# Patient Record
Sex: Female | Born: 1970 | Hispanic: No | Marital: Married | Smoking: Former smoker
Health system: Southern US, Community
[De-identification: ages and names within clinical notes are randomized; demographics above are authoritative.]

## PROBLEM LIST (undated history)

## (undated) DIAGNOSIS — F419 Anxiety disorder, unspecified: Secondary | ICD-10-CM

## (undated) DIAGNOSIS — Z789 Other specified health status: Secondary | ICD-10-CM

## (undated) DIAGNOSIS — E162 Hypoglycemia, unspecified: Secondary | ICD-10-CM

## (undated) HISTORY — DX: Hypoglycemia, unspecified: E16.2

## (undated) HISTORY — DX: Anxiety disorder, unspecified: F41.9

## (undated) HISTORY — DX: Other specified health status: Z78.9

---

## 1995-05-24 HISTORY — PX: BREAST LUMPECTOMY: SHX2

## 2011-11-22 DIAGNOSIS — F419 Anxiety disorder, unspecified: Secondary | ICD-10-CM | POA: Insufficient documentation

## 2014-06-19 DIAGNOSIS — Z803 Family history of malignant neoplasm of breast: Secondary | ICD-10-CM | POA: Insufficient documentation

## 2019-02-07 ENCOUNTER — Ambulatory Visit: Payer: Self-pay | Admitting: Family Medicine

## 2019-10-28 ENCOUNTER — Telehealth: Payer: Self-pay

## 2019-10-29 ENCOUNTER — Telehealth: Payer: Self-pay | Admitting: General Practice

## 2019-10-29 NOTE — Telephone Encounter (Signed)
That's fine. Can schedule all back to back if requested. Ty.

## 2019-10-29 NOTE — Telephone Encounter (Signed)
Good Morning ,   Patient states she and her family (4 people) would like to establish care with you. Per patient she and her family are not in a rush to get in to see you . child are out of school for summer break and would like to see you before they return in the fall.   Please Advise

## 2019-12-02 ENCOUNTER — Ambulatory Visit: Payer: PRIVATE HEALTH INSURANCE | Admitting: Family Medicine

## 2019-12-02 ENCOUNTER — Other Ambulatory Visit: Payer: Self-pay

## 2019-12-02 ENCOUNTER — Encounter: Payer: Self-pay | Admitting: Family Medicine

## 2019-12-02 VITALS — BP 104/68 | HR 66 | Temp 98.0°F | Ht 59.0 in | Wt 117.0 lb

## 2019-12-02 DIAGNOSIS — N939 Abnormal uterine and vaginal bleeding, unspecified: Secondary | ICD-10-CM | POA: Diagnosis not present

## 2019-12-02 DIAGNOSIS — Z114 Encounter for screening for human immunodeficiency virus [HIV]: Secondary | ICD-10-CM | POA: Diagnosis not present

## 2019-12-02 DIAGNOSIS — Z1159 Encounter for screening for other viral diseases: Secondary | ICD-10-CM

## 2019-12-02 DIAGNOSIS — Z23 Encounter for immunization: Secondary | ICD-10-CM

## 2019-12-02 DIAGNOSIS — Z0000 Encounter for general adult medical examination without abnormal findings: Secondary | ICD-10-CM

## 2019-12-02 NOTE — Patient Instructions (Addendum)
Call Center for [facility] at [facility] at [phone] for an appointment.  They are located at [address], [place], [place], [address] (right across the hall from our office).  Give [place] 2-3 business days to get the results of your labs back.   Continue the iron tabs.  Keep the diet clean and stay active.  Let us know if you need anything.

## 2019-12-02 NOTE — Progress Notes (Signed)
Chief Complaint  Patient presents with   New Patient (Initial Visit)   Menstrual Problem    bleeding since June 25     Well Woman Pamela Hicks is here for a complete physical.   Her last physical was >1 year ago.  Current diet: in general, a "healthy" diet. Current exercise: running, wt resistance exercise. Weight is stable and she confirms fatigue. Seatbelt? Yes  Health Maintenance Pap/HPV- No Mammogram- Yes Tetanus- No Hep C screening- No HIV screening- No  Past Medical History:  Diagnosis Date   No known health problems      Past Surgical History:  Procedure Laterality Date   BREAST LUMPECTOMY Right 1997    Medications  Current Outpatient Medications on File Prior to Visit  Medication Sig Dispense Refill   ascorbic acid (VITAMIN C) 500 MG tablet Take 500 mg by mouth daily.     Calcium-Magnesium-Vitamin D (CALCIUM MAGNESIUM PO) Take 1 tablet by mouth daily.     Cyanocobalamin (VITAMIN B 12 PO) Take 1 tablet by mouth daily.     ferrous sulfate 325 (65 FE) MG tablet Take 325 mg by mouth daily with breakfast.     Homeopathic Products (LIVER SUPPORT SL) Place 1 tablet under the tongue daily.     Vitamin D, Cholecalciferol, 25 MCG (1000 UT) CAPS Take 1,000 Units by mouth daily.     Allergies No Known Allergies  Review of Systems: Constitutional:  no unexpected weight changes Eye:  no recent significant change in vision Ear/Nose/Mouth/Throat:  Ears:  no recent change in hearing Nose/Mouth/Throat:  no complaints of nasal congestion, no sore throat Cardiovascular: no chest pain Respiratory:  no shortness of breath Gastrointestinal:  no abdominal pain, no change in bowel habits GU:  Female: negative for dysuria or pelvic pain; +urterine bleeding since 6/25 Musculoskeletal/Extremities:  no pain of the joints Integumentary (Skin/Breast):  no abnormal skin lesions reported Neurologic:  no headaches Endocrine:  denies fatigue Hematologic/Lymphatic:  No  areas of easy bleeding  Exam BP 104/68 (BP Location: Left Arm, Patient Position: Sitting, Cuff Size: Normal)    Pulse 66    Temp 98 F (36.7 C) (Oral)    Ht 4\' 11"  (1.499 m)    Wt 117 lb (53.1 kg)    SpO2 98%    BMI 23.63 kg/m  General:  well developed, well nourished, in no apparent distress Skin:  no significant moles, warts, or growths Head:  no masses, lesions, or tenderness Eyes:  pupils equal and round, sclera anicteric without injection Ears:  canals without lesions, TMs shiny without retraction, no obvious effusion, no erythema Nose:  nares patent, septum midline, mucosa normal, and no drainage or sinus tenderness Throat/Pharynx:  lips and gingiva without lesion; tongue and uvula midline; non-inflamed pharynx; no exudates or postnasal drainage Neck: neck supple without adenopathy, thyromegaly, or masses Lungs:  clear to auscultation, breath sounds equal bilaterally, no respiratory distress Cardio:  regular rate and rhythm, no LE edema Abdomen:  abdomen soft, nontender; bowel sounds normal; no masses or organomegaly Genital: Defer to GYN Musculoskeletal:  symmetrical muscle groups noted without atrophy or deformity Extremities:  no clubbing, cyanosis, or edema, no deformities, no skin discoloration Neuro:  gait normal; deep tendon reflexes normal and symmetric Psych: well oriented with normal range of affect and appropriate judgment/insight  Assessment and Plan  Well adult exam - Plan: CBC, Comprehensive metabolic panel, TSH, Lipid panel, Lipid panel, TSH, Comprehensive metabolic panel, CBC  Abnormal uterine bleeding (AUB)  Screening for HIV (human  immunodeficiency virus) - Plan: HIV Antibody (routine testing w rflx), HIV Antibody (routine testing w rflx)  Encounter for hepatitis C screening test for low risk patient - Plan: Hepatitis C antibody, Hepatitis C antibody  Need for Tdap vaccination - Plan: Tdap vaccine greater than or equal to 7yo IM   Well 49 y.o.  female. Counseled on diet and exercise. Ck labs. If neg, will offer 5 d progesterone challenge. +famhx of hromone+ BC, not sure she wants this but agrees to await lab results. GYN info provided for across the hall so she can est in the area.  Other orders as above. Follow up in 1 yr for CPE or prn otherwise. The patient voiced understanding and agreement to the plan.  Jilda Roche [place], DO 12/02/19 3:22 PM

## 2019-12-03 LAB — LIPID PANEL
Cholesterol: 191 mg/dL (ref 0–200)
HDL: 75.8 mg/dL (ref >39.00)
LDL Cholesterol: 100 mg/dL — ABNORMAL HIGH (ref 0–99)
NonHDL: 115.64
Total CHOL/HDL Ratio: 3
Triglycerides: 78 mg/dL (ref 0.0–149.0)
VLDL: 15.6 mg/dL (ref 0.0–40.0)

## 2019-12-03 LAB — COMPREHENSIVE METABOLIC PANEL
ALT: 12 U/L (ref 0–35)
AST: 18 U/L (ref 0–37)
Albumin: 4.4 g/dL (ref 3.5–5.2)
Alkaline Phosphatase: 58 U/L (ref 39–117)
BUN: 9 mg/dL (ref 6–23)
CO2: 32 mEq/L (ref 19–32)
Calcium: 9.9 mg/dL (ref 8.4–10.5)
Chloride: 100 mEq/L (ref 96–112)
Creatinine, Ser: 0.66 mg/dL (ref 0.40–1.20)
GFR: 95.57 mL/min (ref >60.00)
Glucose, Bld: 82 mg/dL (ref 70–99)
Potassium: 4.8 mEq/L (ref 3.5–5.1)
Sodium: 138 mEq/L (ref 135–145)
Total Bilirubin: 0.5 mg/dL (ref 0.2–1.2)
Total Protein: 6.7 g/dL (ref 6.0–8.3)

## 2019-12-03 LAB — HIV ANTIBODY (ROUTINE TESTING W REFLEX): HIV 1&2 Ab, 4th Generation: NONREACTIVE

## 2019-12-03 LAB — CBC
HCT: 39.8 % (ref 36.0–46.0)
Hemoglobin: 13.6 g/dL (ref 12.0–15.0)
MCHC: 34.2 g/dL (ref 30.0–36.0)
MCV: 94.5 fl (ref 78.0–100.0)
Platelets: 332 10*3/uL (ref 150.0–400.0)
RBC: 4.21 Mil/uL (ref 3.87–5.11)
RDW: 12.6 % (ref 11.5–15.5)
WBC: 9 10*3/uL (ref 4.0–10.5)

## 2019-12-03 LAB — HEPATITIS C ANTIBODY
Hepatitis C Ab: NONREACTIVE
SIGNAL TO CUT-OFF: 0.01 (ref <1.00)

## 2019-12-03 LAB — TSH: TSH: 0.79 u[IU]/mL (ref 0.35–4.50)

## 2019-12-03 MED ORDER — MEDROXYPROGESTERONE ACETATE 10 MG PO TABS: 10.0000 mg | ORAL_TABLET | Freq: Every day | ORAL | 0 refills | Status: DC

## 2019-12-03 NOTE — Addendum Note (Signed)
Addended by: Ellinor Test P on: 12/03/2019 01:38 PM   Modules accepted: Orders  

## 2019-12-18 ENCOUNTER — Other Ambulatory Visit: Payer: Self-pay | Admitting: Family Medicine

## 2019-12-18 DIAGNOSIS — N939 Abnormal uterine and vaginal bleeding, unspecified: Secondary | ICD-10-CM

## 2019-12-18 MED ORDER — NORGESTIMATE-ETH ESTRADIOL 0.25-35 MG-MCG PO TABS: 1.0000 | ORAL_TABLET | Freq: Every day | ORAL | 11 refills | Status: DC

## 2019-12-20 ENCOUNTER — Ambulatory Visit: Payer: PRIVATE HEALTH INSURANCE

## 2019-12-20 ENCOUNTER — Other Ambulatory Visit: Payer: Self-pay | Admitting: Family Medicine

## 2019-12-20 ENCOUNTER — Other Ambulatory Visit: Payer: Self-pay

## 2019-12-20 ENCOUNTER — Ambulatory Visit (INDEPENDENT_AMBULATORY_CARE_PROVIDER_SITE_OTHER): Payer: PRIVATE HEALTH INSURANCE

## 2019-12-20 DIAGNOSIS — N939 Abnormal uterine and vaginal bleeding, unspecified: Secondary | ICD-10-CM | POA: Diagnosis not present

## 2020-01-23 ENCOUNTER — Encounter: Payer: Self-pay | Admitting: Obstetrics & Gynecology

## 2020-01-23 ENCOUNTER — Other Ambulatory Visit (HOSPITAL_COMMUNITY)
Admission: RE | Admit: 2020-01-23 | Discharge: 2020-01-23 | Disposition: A | Discharge status: Home / Self Care | Payer: PRIVATE HEALTH INSURANCE | Source: Ambulatory Visit | Attending: Obstetrics & Gynecology | Admitting: Obstetrics & Gynecology

## 2020-01-23 ENCOUNTER — Ambulatory Visit (INDEPENDENT_AMBULATORY_CARE_PROVIDER_SITE_OTHER): Payer: PRIVATE HEALTH INSURANCE | Admitting: Obstetrics & Gynecology

## 2020-01-23 ENCOUNTER — Other Ambulatory Visit: Payer: Self-pay

## 2020-01-23 VITALS — BP 107/73 | HR 66 | Ht 59.0 in | Wt 112.0 lb

## 2020-01-23 DIAGNOSIS — Z01419 Encounter for gynecological examination (general) (routine) without abnormal findings: Secondary | ICD-10-CM | POA: Insufficient documentation

## 2020-01-23 DIAGNOSIS — N939 Abnormal uterine and vaginal bleeding, unspecified: Secondary | ICD-10-CM | POA: Diagnosis not present

## 2020-01-23 NOTE — Progress Notes (Signed)
Subjective:     Pamela Hicks is a 49 y.o. female here for a routine exam.G2P2  Current complaints: Pt reports 4 weeks or bleeding. Pt reports that she did not have menses in May then she bleed Jun 25- Aug 17. Cycles WNL prior to that time.  The bleeding is now completely stopped for 2 weeks.   Sister breast cancer at 42 years. Mother breast cancer 72 years   Gynecologic History Patient's last menstrual period was 11/15/2019. Contraception: condoms Last Pap: 11/22/2011. Results were: normal Last mammogram: 09/11/2019 WNL  Obstetric History OB History  Gravida Para Term Preterm AB Living  2 2 2     2   SAB TAB Ectopic Multiple Live Births               # Outcome Date GA Lbr Len/2nd Weight Sex Delivery Anes PTL Lv  2 Term           1 Term             The following portions of the patient's history were reviewed and updated as appropriate: allergies, current medications, past family history, past medical history, past social history, past surgical history and problem list.  Review of Systems Pertinent items are noted in HPI.    Objective:  BP 107/73   Pulse 66   Ht 4\' 11"  (1.499 m)   Wt 112 lb (50.8 kg)   LMP 11/15/2019   BMI 22.62 kg/m   General Appearance:    Alert, cooperative, no distress, appears stated age  Head:    Normocephalic, without obvious abnormality, atraumatic  Eyes:    conjunctiva/corneas clear, EOM's intact, both eyes  Ears:    Normal external ear canals, both ears  Nose:   Nares normal, septum midline, mucosa normal, no drainage    or sinus tenderness  Throat:   Lips, mucosa, and tongue normal; teeth and gums normal  Neck:   Supple, symmetrical, trachea midline, no adenopathy;    thyroid:  no enlargement/tenderness/nodules  Back:     Symmetric, no curvature, ROM normal, no CVA tenderness  Lungs:     Clear to auscultation bilaterally, respirations unlabored  Chest Wall:    No tenderness or deformity   Heart:    Regular rate and rhythm, S1 and S2 normal,  no murmur, rub   or gallop  Breast Exam:    No tenderness, masses, or nipple abnormality  Abdomen:     Soft, non-tender, bowel sounds active all four quadrants,    no masses, no organomegaly  Genitalia:    Normal female without lesion, discharge or tenderness   Uterus small mobile, no blood in vault. Ovaries small and palpable.    Extremities:   Extremities normal, atraumatic, no cyanosis or edema  Pulses:   2+ and symmetric all extremities  Skin:   Skin color, texture, turgor normal, no rashes or lesions    Healthy female exam.    12/20/2019 CLINICAL DATA:  Abnormal uterine bleeding, LMP 11/15/2019  EXAM: TRANSABDOMINAL AND TRANSVAGINAL ULTRASOUND OF PELVIS  TECHNIQUE: Both transabdominal and transvaginal ultrasound examinations of the pelvis were performed. Transabdominal technique was performed for global imaging of the pelvis including uterus, ovaries, adnexal regions, and pelvic cul-de-sac. It was necessary to proceed with endovaginal exam following the transabdominal exam to visualize the endometrium, and to characterize a cystic lesion in the LEFT adnexa.  COMPARISON:  None  FINDINGS: Uterus  Measurements: 8.4 x 4.4 x 5.5 cm = volume: 106 mL. Small myometrial cyst  at upper uterus 10 x 8 x 9 mm. No additional mass.  Endometrium  Thickness: 16 mm.  No endometrial fluid or focal abnormality  Right ovary  Measurements: 2.4 x 1.6 x 2.3 cm = volume: 5 mL. Dominant physiologic follicle without mass  Left ovary  Measurements: 5.1 x 3.5 x 4.2 cm = volume: 39 mL. Cyst within LEFT ovary 3.8 x 3.0 x 3.5 cm containing a single thin internal cyst question mature follicle. No additional mass  Other findings  No free pelvic fluid or additional adnexal masses.  IMPRESSION: Large dominant follicle within LEFT ovary.  Thickened endometrial complex 16 mm thick; if bleeding remains unresponsive to hormonal or medical therapy, focal lesion work-up with  sonohysterogram should be considered. Endometrial biopsy should also be considered in pre-menopausal patients at high risk for endometrial carcinoma. (Ref: Radiological Reasoning: Algorithmic Workup of Abnormal Vaginal Bleeding with Endovaginal Sonography and Sonohysterography. AJR 2008; [id number]:T01-77)  Assessment:  Well GYN- PAP performed   AUB- suspect due to perimenopause. No bleeding at present. Since this was the first occurrence followed by a skipped month and bleeding has resolved,  did not perform a endo bx. Pt counseled about perimenopausal bleeding. If the abnormal bleeding returns, pt understands that she should call and will need a endometrial biopsy to  R/o endo ca.       Plan:  F/u PAP with hrHPV F/u in 3 months or sooner prn  Pt will f/u at the [facility] office.  Written info give about perimenopause as assoc sx.    Tonimarie Gritz L. Harraway-Smith, M.D., [place]

## 2020-01-23 NOTE — Progress Notes (Signed)
Pt states last period began on 11/15/19 and lasted for 6 weeks

## 2020-01-23 NOTE — Patient Instructions (Signed)

## 2020-01-24 ENCOUNTER — Encounter: Payer: PRIVATE HEALTH INSURANCE | Admitting: Family Medicine

## 2020-01-24 ENCOUNTER — Encounter: Payer: Self-pay | Admitting: Obstetrics & Gynecology

## 2020-01-24 LAB — CYTOLOGY - PAP
Comment: NEGATIVE
Diagnosis: NEGATIVE
High risk HPV: NEGATIVE

## 2020-04-14 ENCOUNTER — Ambulatory Visit: Payer: PRIVATE HEALTH INSURANCE | Attending: Internal Medicine

## 2020-04-14 ENCOUNTER — Other Ambulatory Visit (HOSPITAL_BASED_OUTPATIENT_CLINIC_OR_DEPARTMENT_OTHER): Payer: Self-pay | Admitting: Internal Medicine

## 2020-04-14 DIAGNOSIS — Z23 Encounter for immunization: Secondary | ICD-10-CM

## 2020-04-14 NOTE — Progress Notes (Signed)
   Covid-19 Vaccination Clinic  Name:  Pamela Hicks    MRN: [id number] DOB: Jul 03, 1970  04/14/2020  [name] was observed post Covid-19 immunization for 15 minutes without incident. She was provided with Vaccine Information Sheet and instruction to access the V-Safe system.   [name] was instructed to call 911 with any severe reactions post vaccine: Marland Kitchen Difficulty breathing  . Swelling of face and throat  . A fast heartbeat  . A bad rash all over body  . Dizziness and weakness   Immunizations Administered    Name Date Dose VIS Date Route   Pfizer COVID-19 Vaccine 04/14/2020 10:00 AM 0.3 mL 03/11/2020 Intramuscular   Manufacturer: [employer], [employer]   Lot: [id number]   NDC: [address]-0

## 2020-04-17 MED FILL — PFIZER-BIONTECH COVID-19 VA: 30 | 1 days supply | Qty: 0 | Fill #0

## 2020-05-18 ENCOUNTER — Encounter: Payer: Self-pay | Admitting: Obstetrics & Gynecology

## 2020-05-18 ENCOUNTER — Other Ambulatory Visit: Payer: Self-pay

## 2020-05-18 ENCOUNTER — Ambulatory Visit (INDEPENDENT_AMBULATORY_CARE_PROVIDER_SITE_OTHER): Payer: PRIVATE HEALTH INSURANCE | Admitting: Obstetrics & Gynecology

## 2020-05-18 VITALS — BP 91/62 | HR 58 | Ht 59.0 in | Wt 113.0 lb

## 2020-05-18 DIAGNOSIS — Z1211 Encounter for screening for malignant neoplasm of colon: Secondary | ICD-10-CM

## 2020-05-18 NOTE — Progress Notes (Signed)
History:  49 y.o. [id number] here today for f/u of AUB thought due to perimenopause. Pt reports that her sx are improved and her menses have been regular. She does report some insomnia and hot flushes. She does drink a significant amount of caffeine.     The following portions of the patient's history were reviewed and updated as appropriate: allergies, current medications, past family history, past medical history, past social history, past surgical history and problem list.  Review of Systems:  Pertinent items are noted in HPI.    Objective:  Physical Exam Blood pressure 91/62, pulse (!) 58, height 4\' 11"  (1.499 m), weight 113 lb (51.3 kg), last menstrual period 05/08/2020.  CONSTITUTIONAL: Well-developed, well-nourished female in no acute distress.  HENT:  Normocephalic, atraumatic EYES: Conjunctivae and EOM are normal. No scleral icterus.  NECK: Normal range of motion SKIN: Skin is warm and dry. No rash noted. Not diaphoretic.No pallor. NEUROLGIC: Alert and oriented to person, place, and time. Normal coordination.   12/20/2019 CLINICAL DATA:  Abnormal uterine bleeding, LMP 11/15/2019  EXAM: TRANSABDOMINAL AND TRANSVAGINAL ULTRASOUND OF PELVIS  TECHNIQUE: Both transabdominal and transvaginal ultrasound examinations of the pelvis were performed. Transabdominal technique was performed for global imaging of the pelvis including uterus, ovaries, adnexal regions, and pelvic cul-de-sac. It was necessary to proceed with endovaginal exam following the transabdominal exam to visualize the endometrium, and to characterize a cystic lesion in the LEFT adnexa.  COMPARISON:  None  FINDINGS: Uterus  Measurements: 8.4 x 4.4 x 5.5 cm = volume: 106 mL. Small myometrial cyst at upper uterus 10 x 8 x 9 mm. No additional mass.  Endometrium  Thickness: 16 mm.  No endometrial fluid or focal abnormality  Right ovary  Measurements: 2.4 x 1.6 x 2.3 cm = volume: 5 mL.  Dominant physiologic follicle without mass  Left ovary  Measurements: 5.1 x 3.5 x 4.2 cm = volume: 39 mL. Cyst within LEFT ovary 3.8 x 3.0 x 3.5 cm containing a single thin internal cyst question mature follicle. No additional mass  Other findings  No free pelvic fluid or additional adnexal masses.  IMPRESSION: Large dominant follicle within LEFT ovary.  Thickened endometrial complex 16 mm thick; if bleeding remains unresponsive to hormonal or medical therapy, focal lesion work-up with sonohysterogram should be considered. Endometrial biopsy should also be considered in pre-menopausal patients at high risk for endometrial carcinoma. (Ref: Radiological Reasoning: Algorithmic Workup of Abnormal Vaginal Bleeding with Endovaginal Sonography and Sonohysterography. AJR 200806-01-2000)   Assessment & Plan:  Perimenopause- Pt bleeding sx have improved. Hot flushes are worse. Insomnia.    Cut down or stop the caffeine.   Will hold off on management of the hot flushes for now.   F/u in 8-9 months for annual or sooner prn   Total face-to-face time with patient was 20 min.  Greater than 50% was spent in counseling and coordination of care with the patient.   Shawny Borkowski L. Harraway-Smith, M.D., ; [phone]

## 2020-05-18 NOTE — Patient Instructions (Signed)

## 2020-11-16 ENCOUNTER — Other Ambulatory Visit: Payer: Self-pay

## 2020-11-16 ENCOUNTER — Ambulatory Visit: Payer: No Typology Code available for payment source | Admitting: Family Medicine

## 2020-11-16 ENCOUNTER — Encounter: Payer: Self-pay | Admitting: Family Medicine

## 2020-11-16 VITALS — BP 118/78 | HR 70 | Temp 98.0°F | Ht 60.0 in | Wt 116.0 lb

## 2020-11-16 DIAGNOSIS — S46819A Strain of other muscles, fascia and tendons at shoulder and upper arm level, unspecified arm, initial encounter: Secondary | ICD-10-CM | POA: Diagnosis not present

## 2020-11-16 DIAGNOSIS — M25511 Pain in right shoulder: Secondary | ICD-10-CM

## 2020-11-16 DIAGNOSIS — M25512 Pain in left shoulder: Secondary | ICD-10-CM | POA: Diagnosis not present

## 2020-11-16 MED ORDER — MELOXICAM 15 MG PO TABS: 15.0000 mg | ORAL_TABLET | Freq: Every day | ORAL | 0 refills | Status: DC

## 2020-11-16 NOTE — Progress Notes (Signed)
Musculoskeletal Exam  Patient: Pamela Hicks DOB: 01/28/1971  DOS: 11/16/2020  SUBJECTIVE:  Chief Complaint:   Chief Complaint  Patient presents with   Shoulder Pain    Right and left shoulders hurting     Pamela Hicks is a 50 y.o.  female for evaluation and treatment of bilateral shoulder pain.   Onset:  5 weeks ago. No inj or change in activity.  Location: From neck to shoulder on both  Character:  aching  Using it will hurt Progression of issue:  is worsening Associated symptoms: No redness, bruising, swelling, decreased ROM Treatment: to date has been rest and ice.   Neurovascular symptoms: no  Past Medical History:  Diagnosis Date   No known health problems     Objective: VITAL SIGNS: BP 118/78   Pulse 70   Temp 98 F (36.7 C) (Oral)   Ht 5' (1.524 m)   Wt 116 lb (52.6 kg)   SpO2 98%   BMI 22.65 kg/m  Constitutional: Well formed, well developed. No acute distress. Thorax & Lungs: No accessory muscle use Musculoskeletal: shoulders.   Normal active range of motion: yes.   Normal passive range of motion: yes Tenderness to palpation: yes Deformity: no Ecchymosis: no Tests positive: Hawkins and empty can on L Tests negative: Hawkins and empty can on R; Neer's, lift off, speed's,  Neurologic: Normal sensory function. No focal deficits noted. DTR's equal and symmetric in UE's. No clonus. Psychiatric: Normal mood. Age appropriate judgment and insight. Alert & oriented x 3.    Assessment:  Strain of trapezius muscle, unspecified laterality, initial encounter - Plan: meloxicam (MOBIC) 15 MG tablet  Acute pain of both shoulders - Plan: meloxicam (MOBIC) 15 MG tablet  Plan: Stretches/exercises, heat, ice, Tylenol.  Send message in 1 month if no improvement.  We will set up with sports medicine versus physical therapy. F/u prn. The patient voiced understanding and agreement to the plan.   Jilda Roche [place], DO 11/16/20  12:12 PM

## 2020-11-16 NOTE — Patient Instructions (Addendum)
Heat (pad or rice pillow in microwave) over affected area, 10-15 minutes twice daily.   Ice/cold pack over area for 10-15 min twice daily.  OK to take Tylenol 1000 mg (2 extra strength tabs) or 975 mg (3 regular strength tabs) every 6 hours as needed.  Send me a message in 1 month if we aren't improving.   Let us know if you need anything.  Trapezius stretches/exercises Do exercises exactly as told by your health care provider and adjust them as directed. It is normal to feel mild stretching, pulling, tightness, or discomfort as you do these exercises, but you should stop right away if you feel sudden pain or your pain gets worse.   Stretching and range of motion exercises These exercises warm up your muscles and joints and improve the movement and flexibility of your shoulder. These exercises can also help to relieve pain, numbness, and tingling. If you are unable to do any of the following for any reason, do not further attempt to do it.   Exercise A: Flexion, standing     Stand and hold a broomstick, a cane, or a similar object. Place your hands a little more than shoulder-width apart on the object. Your left / right hand should be palm-up, and your other hand should be palm-down. Push the stick to raise your left / right arm out to your side and then over your head. Use your other hand to help move the stick. Stop when you feel a stretch in your shoulder, or when you reach the angle that is recommended by your health care provider. Avoid shrugging your shoulder while you raise your arm. Keep your shoulder blade tucked down toward your spine. Hold for 30 seconds. Slowly return to the starting position. Repeat 2 times. Complete this exercise 3 times per week.  Exercise B: Abduction, supine     Lie on your back and hold a broomstick, a cane, or a similar object. Place your hands a little more than shoulder-width apart on the object. Your left / right hand should be palm-up, and your  other hand should be palm-down. Push the stick to raise your left / right arm out to your side and then over your head. Use your other hand to help move the stick. Stop when you feel a stretch in your shoulder, or when you reach the angle that is recommended by your health care provider. Avoid shrugging your shoulder while you raise your arm. Keep your shoulder blade tucked down toward your spine. Hold for 30 seconds. Slowly return to the starting position. Repeat 2 times. Complete this exercise 3 times per week.  Exercise C: Flexion, active-assisted     Lie on your back. You may bend your knees for comfort. Hold a broomstick, a cane, or a similar object. Place your hands about shoulder-width apart on the object. Your palms should face toward your feet. Raise the stick and move your arms over your head and behind your head, toward the floor. Use your healthy arm to help your left / right arm move farther. Stop when you feel a gentle stretch in your shoulder, or when you reach the angle where your health care provider tells you to stop. Hold for 30 seconds. Slowly return to the starting position. Repeat 2 times. Complete this exercise 3 times per week.  Exercise D: External rotation and abduction     Stand in a door frame with one of your feet slightly in front of the other. This  is called a staggered stance. Choose one of the following positions as told by your health care provider: Place your hands and forearms on the door frame above your head. Place your hands and forearms on the door frame at the height of your head. Place your hands on the door frame at the height of your elbows. Slowly move your weight onto your front foot until you feel a stretch across your chest and in the front of your shoulders. Keep your head and chest upright and keep your abdominal muscles tight. Hold for 30 seconds. To release the stretch, shift your weight to your back foot. Repeat 2 times. Complete this  stretch 3 times per week.  Strengthening exercises These exercises build strength and endurance in your shoulder. Endurance is the ability to use your muscles for a long time, even after your muscles get tired. Exercise E: Scapular depression and adduction  Sit on a stable chair. Support your arms in front of you with pillows, armrests, or a tabletop. Keep your elbows in line with the sides of your body. Gently move your shoulder blades down toward your middle back. Relax the muscles on the tops of your shoulders and in the back of your neck. Hold for 3 seconds. Slowly release the tension and relax your muscles completely before doing this exercise again. Repeat for a total of 10 repetitions. After you have practiced this exercise, try doing the exercise without the arm support. Then, try the exercise while standing instead of sitting. Repeat 2 times. Complete this exercise 3 times per week.  Exercise F: Shoulder abduction, isometric     Stand or sit about 4-6 inches (10-15 cm) from a wall with your left / right side facing the wall. Bend your left / right elbow and gently press your elbow against the wall. Increase the pressure slowly until you are pressing as hard as you can without shrugging your shoulder. Hold for 3 seconds. Slowly release the tension and relax your muscles completely. Repeat for a total of 10 repetitions. Repeat 2 times. Complete this exercise 3 times per week.  Exercise G: Shoulder flexion, isometric     Stand or sit about 4-6 inches (10-15 cm) away from a wall with your left / right side facing the wall. Keep your left / right elbow straight and gently press the top of your fist against the wall. Increase the pressure slowly until you are pressing as hard as you can without shrugging your shoulder. Hold for 10-15 seconds. Slowly release the tension and relax your muscles completely. Repeat for a total of 10 repetitions. Repeat 2 times. Complete this exercise 3  times per week.  Exercise H: Internal rotation     Sit in a stable chair without armrests, or stand. Secure an exercise band at your left / right side, at elbow height. Place a soft object, such as a folded towel or a small pillow, under your left / right upper arm so your elbow is a few inches (about 8 cm) away from your side. Hold the end of the exercise band so the band stretches. Keeping your elbow pressed against the soft object under your arm, move your forearm across your body toward your abdomen. Keep your body steady so the movement is only coming from your shoulder. Hold for 3 seconds. Slowly return to the starting position. Repeat for a total of 10 repetitions. Repeat 2 times. Complete this exercise 3 times per week.  Exercise I: External rotation  Sit in a stable chair without armrests, or stand. Secure an exercise band at your left / right side, at elbow height. Place a soft object, such as a folded towel or a small pillow, under your left / right upper arm so your elbow is a few inches (about 8 cm) away from your side. Hold the end of the exercise band so the band stretches. Keeping your elbow pressed against the soft object under your arm, move your forearm out, away from your abdomen. Keep your body steady so the movement is only coming from your shoulder. Hold for 3 seconds. Slowly return to the starting position. Repeat for a total of 10 repetitions. Repeat 2 times. Complete this exercise 3 times per week. Exercise J: Shoulder extension  Sit in a stable chair without armrests, or stand. Secure an exercise band to a stable object in front of you so the band is at shoulder height. Hold one end of the exercise band in each hand. Your palms should face each other. Straighten your elbows and lift your hands up to shoulder height. Step back, away from the secured end of the exercise band, until the band stretches. Squeeze your shoulder blades together and pull your hands  down to the sides of your thighs. Stop when your hands are straight down by your sides. Do not let your hands go behind your body. Hold for 3 seconds. Slowly return to the starting position. Repeat for a total of 10 repetitions. Repeat 2 times. Complete this exercise 3 times per week.  Exercise K: Shoulder extension, prone     Lie on your abdomen on a firm surface so your left / right arm hangs over the edge. Hold a 5 lb weight in your hand so your palm faces in toward your body. Your arm should be straight. Squeeze your shoulder blade down toward the middle of your back. Slowly raise your arm behind you, up to the height of the surface that you are lying on. Keep your arm straight. Hold for 3 seconds. Slowly return to the starting position and relax your muscles. Repeat for a total of 10 repetitions. Repeat 2 times. Complete this exercise 3 times per week.   Exercise L: Horizontal abduction, prone  Lie on your abdomen on a firm surface so your left / right arm hangs over the edge. Hold a 5 lb weight in your hand so your palm faces toward your feet. Your arm should be straight. Squeeze your shoulder blade down toward the middle of your back. Bend your elbow so your hand moves up, until your elbow is bent to an "L" shape (90 degrees). With your elbow bent, slowly move your forearm forward and up. Raise your hand up to the height of the surface that you are lying on. Your upper arm should not move, and your elbow should stay bent. At the top of the movement, your palm should face the floor. Hold for 3 seconds. Slowly return to the starting position and relax your muscles. Repeat for a total of 10 repetitions. Repeat 2 times. Complete this exercise 3 times per week.  Exercise M: Horizontal abduction, standing  Sit on a stable chair, or stand. Secure an exercise band to a stable object in front of you so the band is at shoulder height. Hold one end of the exercise band in each  hand. Straighten your elbows and lift your hands straight in front of you, up to shoulder height. Your palms should face down, toward  the floor. Step back, away from the secured end of the exercise band, until the band stretches. Move your arms out to your sides, and keep your arms straight. Hold for 3 seconds. Slowly return to the starting position. Repeat for a total of 10 repetitions. Repeat 2 times. Complete this exercise 3 times per week.  Exercise N: Scapular retraction and elevation  Sit on a stable chair, or stand. Secure an exercise band to a stable object in front of you so the band is at shoulder height. Hold one end of the exercise band in each hand. Your palms should face each other. Sit in a stable chair without armrests, or stand. Step back, away from the secured end of the exercise band, until the band stretches. Squeeze your shoulder blades together and lift your hands over your head. Keep your elbows straight. Hold for 3 seconds. Slowly return to the starting position. Repeat for a total of 10 repetitions. Repeat 2 times. Complete this exercise 3 times per week.  This information is not intended to replace advice given to you by your health care provider. Make sure you discuss any questions you have with your health care provider. Document Released: 05/09/2005 Document Revised: 01/14/2016 Document Reviewed: 03/26/2015 Else[employer] Interactive Patient Education  2017 [employer]  EXERCISES  RANGE OF MOTION (ROM) AND STRETCHING EXERCISES These exercises may help you when beginning to rehabilitate your injury. While completing these exercises, remember:  Restoring tissue flexibility helps normal motion to return to the joints. This allows healthier, less painful movement and activity. An effective stretch should be held for at least 30 seconds. A stretch should never be painful. You should only feel a gentle lengthening or release in the stretched tissue.  ROM -  Pendulum Bend at the waist so that your right / left arm falls away from your body. Support yourself with your opposite hand on a solid surface, such as a table or a countertop. Your right / left arm should be perpendicular to the ground. If it is not perpendicular, you need to lean over farther. Relax the muscles in your right / left arm and shoulder as much as possible. Gently sway your hips and trunk so they move your right / left arm without any use of your right / left shoulder muscles. Progress your movements so that your right / left arm moves side to side, then forward and backward, and finally, both clockwise and counterclockwise. Complete 10-15 repetitions in each direction. Many people use this exercise to relieve discomfort in their shoulder as well as to gain range of motion. Repeat 2 times. Complete this exercise 3 times per week.  STRETCH - Flexion, Standing Stand with good posture. With an underhand grip on your right / left hand and an overhand grip on the opposite hand, grasp a broomstick or cane so that your hands are a little more than shoulder-width apart. Keeping your right / left elbow straight and shoulder muscles relaxed, push the stick with your opposite hand to raise your right / left arm in front of your body and then overhead. Raise your arm until you feel a stretch in your right / left shoulder, but before you have increased shoulder pain. Try to avoid shrugging your right / left shoulder as your arm rises by keeping your shoulder blade tucked down and toward your mid-back spine. Hold 30 seconds. Slowly return to the starting position. Repeat 2 times. Complete this exercise 3 times per week.  STRETCH - Internal Rotation  Place your right / left hand behind your back, palm-up. Throw a towel or belt over your opposite shoulder. Grasp the towel/belt with your right / left hand. While keeping an upright posture, gently pull up on the towel/belt until you feel a stretch in  the front of your right / left shoulder. Avoid shrugging your right / left shoulder as your arm rises by keeping your shoulder blade tucked down and toward your mid-back spine. Hold 30. Release the stretch by lowering your opposite hand. Repeat 2 times. Complete this exercise 3 times per week.  STRETCH - External Rotation and Abduction Stagger your stance through a doorframe. It does not matter which foot is forward. As instructed by your physician, physical therapist or athletic trainer, place your hands: And forearms above your head and on the door frame. And forearms at head-height and on the door frame. At elbow-height and on the door frame. Keeping your head and chest upright and your stomach muscles tight to prevent over-extending your low-back, slowly shift your weight onto your front foot until you feel a stretch across your chest and/or in the front of your shoulders. Hold 30 seconds. Shift your weight to your back foot to release the stretch. Repeat 2 times. Complete this stretch 3 times per week.   STRENGTHENING EXERCISES  These exercises may help you when beginning to rehabilitate your injury. They may resolve your symptoms with or without further involvement from your physician, physical therapist or athletic trainer. While completing these exercises, remember:  Muscles can gain both the endurance and the strength needed for everyday activities through controlled exercises. Complete these exercises as instructed by your physician, physical therapist or athletic trainer. Progress the resistance and repetitions only as guided. You may experience muscle soreness or fatigue, but the pain or discomfort you are trying to eliminate should never worsen during these exercises. If this pain does worsen, stop and make certain you are following the directions exactly. If the pain is still present after adjustments, discontinue the exercise until you can discuss the trouble with your  clinician. If advised by your physician, during your recovery, avoid activity or exercises which involve actions that place your right / left hand or elbow above your head or behind your back or head. These positions stress the tissues which are trying to heal.  STRENGTH - Scapular Depression and Adduction With good posture, sit on a firm chair. Supported your arms in front of you with pillows, arm rests or a table top. Have your elbows in line with the sides of your body. Gently draw your shoulder blades down and toward your mid-back spine. Gradually increase the tension without tensing the muscles along the top of your shoulders and the back of your neck. Hold for 3 seconds. Slowly release the tension and relax your muscles completely before completing the next repetition. After you have practiced this exercise, remove the arm support and complete it in standing as well as sitting. Repeat 2 times. Complete this exercise 3 times per week.   STRENGTH - External Rotators Secure a rubber exercise band/tubing to a fixed object so that it is at the same height as your right / left elbow when you are standing or sitting on a firm surface. Stand or sit so that the secured exercise band/tubing is at your side that is not injured. Bend your elbow 90 degrees. Place a folded towel or small pillow under your right / left arm so that your elbow is a few inches  away from your side. Keeping the tension on the exercise band/tubing, pull it away from your body, as if pivoting on your elbow. Be sure to keep your body steady so that the movement is only coming from your shoulder rotating. Hold 3 seconds. Release the tension in a controlled manner as you return to the starting position. Repeat 2 times. Complete this exercise 3 times per week.   STRENGTH - Supraspinatus Stand or sit with good posture. Grasp a 2-3 lb weight or an exercise band/tubing so that your hand is "thumbs-up," like when you shake hands. Slowly  lift your right / left hand from your thigh into the air, traveling about 30 degrees from straight out at your side. Lift your hand to shoulder height or as far as you can without increasing any shoulder pain. Initially, many people do not lift their hands above shoulder height. Avoid shrugging your right / left shoulder as your arm rises by keeping your shoulder blade tucked down and toward your mid-back spine. Hold for 3 seconds. Control the descent of your hand as you slowly return to your starting position. Repeat 2 times. Complete this exercise 3 times per week.   STRENGTH - Shoulder Extensors Secure a rubber exercise band/tubing so that it is at the height of your shoulders when you are either standing or sitting on a firm arm-less chair. With a thumbs-up grip, grasp an end of the band/tubing in each hand. Straighten your elbows and lift your hands straight in front of you at shoulder height. Step back away from the secured end of band/tubing until it becomes tense. Squeezing your shoulder blades together, pull your hands down to the sides of your thighs. Do not allow your hands to go behind you. Hold for 3 seconds. Slowly ease the tension on the band/tubing as you reverse the directions and return to the starting position. Repeat 2 times. Complete this exercise 3 times per week.   STRENGTH - Scapular Retractors Secure a rubber exercise band/tubing so that it is at the height of your shoulders when you are either standing or sitting on a firm arm-less chair. With a palm-down grip, grasp an end of the band/tubing in each hand. Straighten your elbows and lift your hands straight in front of you at shoulder height. Step back away from the secured end of band/tubing until it becomes tense. Squeezing your shoulder blades together, draw your elbows back as you bend them. Keep your upper arm lifted away from your body throughout the exercise. Hold 3 seconds. Slowly ease the tension on the band/tubing  as you reverse the directions and return to the starting position. Repeat 2 times. Complete this exercise 3 times per week.  STRENGTH - Scapular Depressors Find a sturdy chair without wheels, such as a from a dining room table. Keeping your feet on the floor, lift your bottom from the seat and lock your elbows. Keeping your elbows straight, allow gravity to pull your body weight down. Your shoulders will rise toward your ears. Raise your body against gravity by drawing your shoulder blades down your back, shortening the distance between your shoulders and ears. Although your feet should always maintain contact with the floor, your feet should progressively support less body weight as you get stronger. Hold 3 seconds. In a controlled and slow manner, lower your body weight to begin the next repetition. Repeat 2 times. Complete this exercise 3 times per week.    This information is not intended to replace advice given  to you by your health care provider. Make sure you discuss any questions you have with your health care provider.   Document Released: 03/23/2005 Document Revised: 05/30/2014 Document Reviewed: 08/21/2008 Else[employer] Interactive Patient Education [employer]

## 2021-01-04 ENCOUNTER — Ambulatory Visit: Payer: No Typology Code available for payment source | Admitting: Family Medicine

## 2021-01-04 ENCOUNTER — Encounter: Payer: Self-pay | Admitting: Family Medicine

## 2021-01-04 ENCOUNTER — Other Ambulatory Visit: Payer: Self-pay | Admitting: Family Medicine

## 2021-01-04 ENCOUNTER — Other Ambulatory Visit: Payer: Self-pay

## 2021-01-04 VITALS — BP 108/60 | HR 66 | Temp 97.7°F | Ht 60.0 in | Wt 116.4 lb

## 2021-01-04 DIAGNOSIS — M25552 Pain in left hip: Secondary | ICD-10-CM | POA: Diagnosis not present

## 2021-01-04 DIAGNOSIS — M25551 Pain in right hip: Secondary | ICD-10-CM | POA: Diagnosis not present

## 2021-01-04 NOTE — Progress Notes (Signed)
Musculoskeletal Exam  Patient: Pamela Hicks DOB: [id number]  DOS: 01/04/2021  SUBJECTIVE:  Chief Complaint:   Chief Complaint  Patient presents with   Hip Pain    Both hips hurt, but right is worse    Pamela Hicks is a 50 y.o.  female for evaluation and treatment of b/l hip pain. Here w spouse.   Onset:  5 months ago. No inj or change in activity.  Location: outer hip radiating down thigh Character:  sharp  Progression of issue:  has worsened Associated symptoms: Difficult when getting in and out of the car Denies: redness, bruising, swelling. Treatment: to date has been rest and home exercises.   Neurovascular symptoms: no  Past Medical History:  Diagnosis Date   No known health problems     Objective: VITAL SIGNS: BP 108/60   Pulse 66   Temp 97.7 F (36.5 C) (Oral)   Ht 5' (1.524 m)   Wt 116 lb 6 oz (52.8 kg)   SpO2 99%   BMI 22.73 kg/m  Constitutional: Well formed, well developed. No acute distress. Thorax & Lungs: No accessory muscle use Musculoskeletal: hips.   Normal active range of motion: yes.   Normal passive range of motion: yes Tenderness to palpation: no Deformity: no Ecchymosis: no Tests positive: Log roll on L Tests negative: Log roll on R; FABER, FADDIR, Oer's b/l Neurologic: Normal sensory function. No focal deficits noted. DTR's equal and symmetric in LE's. No clonus. Psychiatric: Normal mood. Age appropriate judgment and insight. Alert & oriented x 3.    Assessment:  Bilateral hip pain  Plan: Stretches/exercises, heat, ice, Tylenol.  Anti-inflammatories.  I would like to check an x-ray.  She works for [employer] so she will give me the information so I can place an order for that area. F/u pending the above. The patient voiced understanding and agreement to the plan.   Jilda Roche Farley, DO 01/04/21  11:40 AM

## 2021-01-04 NOTE — Patient Instructions (Addendum)
Heat (pad or rice pillow in microwave) over affected area, 10-15 minutes twice daily.   Ice/cold pack over area for 10-15 min twice daily.  OK to take Tylenol 1000 mg (2 extra strength tabs) or 975 mg (3 regular strength tabs) every 6 hours as needed.  Send me a message 1-2 days after you have your X-ray done.  Let us know if you need anything.  Gluteus Medius Syndrome Rehab It is normal to feel mild stretching, pulling, tightness, or discomfort as you do these exercises, but you should stop right away if you feel sudden pain or your pain gets worse.   Stretching and range of motion exercise This exercise warms up your muscles and joints and improves the movement and flexibility of your hip and pelvis. This exercise also helps to relieve pain and stiffness. Exercise A: Lunge (hip flexor stretch)      Kneel on the floor on your left / right knee. Bend your other knee so it is directly over your ankle. Keep good posture with your head over your shoulders. Tuck your tailbone underneath you. This will prevent your back from arching too much. You should feel a gentle stretch in the front of your thigh or hip. If you do not feel a stretch, slowly lunge forward with your chest up. Hold this position for 30 seconds. Slowly return to the starting position. Repeat 2 times. Complete this exercise 3 times per week. Strengthening exercises These exercises build strength and endurance in your hip and pelvis. Endurance is the ability to use your muscles for a long time, even after they get tired. Exercise B: Bridge (hip extensors)     Lie on your back on a firm surface with your knees bent and your feet flat on the floor. Tighten your buttocks muscles and lift your bottom off the floor until the trunk of your body is level with your thighs. You should feel the muscles working in your buttocks and the back of your thighs. If this exercise is too easy, cross your arms over your chest or lift one leg  while your bottom is up off the floor. Do not arch your back. Hold this position for 3 seconds. Slowly lower your hips to the starting position. Let your muscles relax completely between repetitions. Repeat 2 times. Complete this exercise 3 times per week. Exercise C: Straight leg raises (hip abductors)     Lie on your side with your left / right leg in the top position. Lie so your head, shoulder, knee, and hip line up. Bend your bottom knee to help you balance. Lift your top leg up 4-6 inches (10-15 cm), keeping your toes pointed straight ahead. Hold this position for 2 seconds. Slowly lower your leg to the starting position and let your muscles relax completely. Repeat for a total of 10 repetitions. Repeat 2 times. Complete this exercise 3 times per week. Exercise D: Hip abductors and external rotators, quadruped Get on your hands and knees on a firm, lightly padded surface. Your hands should be directly below your shoulders, and your knees should be directly below your hips. Lift your left / right knee out to the side. Keep your knee bent. Do not twist your body. Hold this position for 3 seconds. Slowly lower your leg. Repeat for a total of 10 repetitions.  Repeat 2 times. Complete this exercise 3 times per week. Exercise E: Single leg stand Stand near a counter or door frame to hold onto as needed. It  is helpful to look in a mirror for this exercise so you can watch your hip. Squeeze your left / right buttock muscles then lift up your other foot. Do not let your left / right hip push out to the side. Hold this position for 3 seconds. Repeat for a total of 10 repetitions. Repeat 2 times. Complete this exercise 3 times per week. Make sure you discuss any questions you have with your health care provider. Document Released: 05/09/2005 Document Revised: 01/14/2016 Document Reviewed: 04/21/2015 Elsevier Interactive Patient Education  2018 [employer]  Hip Exercises It is normal to  feel mild stretching, pulling, tightness, or discomfort as you do these exercises, but you should stop right away if you feel sudden pain or your pain gets worse.   STRETCHING AND RANGE OF MOTION EXERCISES These exercises warm up your muscles and joints and improve the movement and flexibility of your hip. These exercises also help to relieve pain, numbness, and tingling. Exercise A: Hamstrings, Supine  Lie on your back. Loop a belt or towel over the ball of your left / right foot. The ball of your foot is on the walking surface, right under your toes. Straighten your left / right knee and slowly pull on the belt to raise your leg. Do not let your left / right knee bend while you do this. Keep your other leg flat on the floor. Raise the left / right leg until you feel a gentle stretch behind your left / right knee or thigh. Hold this position for 30 seconds. Slowly return your leg to the starting position. Repeat2 times. Complete this stretch 3 times per week. Exercise B: Hip Rotators  Lie on your back on a firm surface. Hold your left / right knee with your left / right hand. Hold your ankle with your other hand. Gently pull your left / right knee and rotate your lower leg toward your other shoulder. Pull until you feel a stretch in your buttocks. Keep your hips and shoulders firmly planted while you do this stretch. Hold this position for 30 seconds. Repeat 2 times. Complete this stretch 3 times per week. Exercise C: V-Sit (Hamstrings and Adductors)  Sit on the floor with your legs extended in a large "V" shape. Keep your knees straight during this exercise. Start with your head and chest upright, then bend at your waist to reach for your left foot (position A). You should feel a stretch in your right inner thigh. Hold this position for 30 seconds. Then slowly return to the upright position. Bend at your waist to reach forward (position B). You should feel a stretch behind both of your  thighs and knees. Hold this position for 30 seconds. Then slowly return to the upright position. Bend at your waist to reach for your right foot (position C). You should feel a stretch in your left inner thigh. Hold this position for 30 seconds. Then slowly return to the upright position. Repeat A, B, and C 2 times each. Complete this stretch 3 times per week. Exercise D: Lunge (Hip Flexors)  Place your left / right knee on the floor and bend your other knee so that is directly over your ankle. You should be half-kneeling. Keep good posture with your head over your shoulders. Tighten your buttocks to point your tailbone downward. This helps your back to keep from arching too much. You should feel a gentle stretch in the front of your left / right thigh and hip. If you  do not feel any resistance, slightly slide your other foot forward and then slowly lunge forward so your knee once again lines up over your ankle. Make sure your tailbone continues to point downward. Hold this position for 30 seconds. Repeat 2 times. Complete this stretch 3 times per week.  STRENGTHENING EXERCISES These exercises build strength and endurance in your hip. Endurance is the ability to use your muscles for a long time, even after they get tired. Exercise E: Bridge (Hip Extensors)  Lie on your back on a firm surface with your knees bent and your feet flat on the floor. Tighten your buttocks muscles and lift your bottom off the floor until the trunk of your body is level with your thighs. Do not arch your back. You should feel the muscles working in your buttocks and the back of your thighs. If you do not feel these muscles, slide your feet 1-2 inches (2.5-5 cm) farther away from your buttocks. Hold this position for 3 seconds. Slowly lower your hips to the starting position. Repeat for a total of 10 repetitions. Let your muscles relax completely between repetitions. If this exercise is too easy, try doing it with  your arms crossed over your chest. Repeat 2 times. Complete this exercise 3 times per week. Exercise F: Straight Leg Raises - Hip Abductors  Lie on your side with your left / right leg in the top position. Lie so your head, shoulder, knee, and hip line up with each other. You may bend your bottom knee to help you balance. Roll your hips slightly forward, so your hips are stacked directly over each other and your left / right knee is facing forward. Leading with your heel, lift your top leg 4-6 inches (10-15 cm). You should feel the muscles in your outer hip lifting. Do not let your foot drift forward. Do not let your knee roll toward the ceiling. Hold this position for 1 second. Slowly return to the starting position. Let your muscles relax completely between repetitions. Repeat for a total of 10 repetitions.  Repeat 2 times. Complete this exercise 3 times per week. Exercise G: Straight Leg Raises - Hip Adductors  Lie on your side with your left / right leg in the bottom position. Lie so your head, shoulder, knee, and hip line up. You may place your upper foot in front to help you balance. Roll your hips slightly forward, so your hips are stacked directly over each other and your left / right knee is facing forward. Tense the muscles in your inner thigh and lift your bottom leg 4-6 inches (10-15 cm). Hold this position for 1 second. Slowly return to the starting position. Let your muscles relax completely between repetitions. Repeat for a total of 10 repetitions. Repeat 2 times. Complete this exercise 3 times per week. Exercise H: Straight Leg Raises - Quadriceps  Lie on your back with your left / right leg extended and your other knee bent. Tense the muscles in the front of your left / right thigh. When you do this, you should see your kneecap slide up or see increased dimpling just above your knee. Tighten these muscles even more and raise your leg 4-6 inches (10-15 cm) off the  floor. Hold this position for 3 seconds. Keep these muscles tense as you lower your leg. Relax the muscles slowly and completely between repetitions. Repeat for a total of 10 repetitions. Repeat 2 times. Complete this exercise 3 times per week. Exercise I: Hip Abductors,  Standing Tie one end of a rubber exercise band or tubing to a secure surface, such as a table or pole. Loop the other end of the band or tubing around your left / right ankle. Keeping your ankle with the band or tubing directly opposite of the secured end, step away until there is tension in the tubing or band. Hold onto a chair as needed for balance. Lift your left / right leg out to your side. While you do this: Keep your back upright. Keep your shoulders over your hips. Keep your toes pointing forward. Make sure to use your hip muscles to lift your leg. Do not "throw" your leg or tip your body to lift your leg. Hold this position for 1 second. Slowly return to the starting position. Repeat for a total of 10 repetitions. Repeat 2 times. Complete this exercise 3 times per week. Exercise J: Squats (Quadriceps) Stand in a door frame so your feet and knees are in line with the frame. You may place your hands on the frame for balance. Slowly bend your knees and lower your hips like you are going to sit in a chair. Keep your lower legs in a straight-up-and-down position. Do not let your hips go lower than your knees. Do not bend your knees lower than told by your health care provider. If your hip pain increases, do not bend as low. Hold this position for 1 second. Slowly push with your legs to return to standing. Do not use your hands to pull yourself to standing. Repeat for a total of 10 repetitions. Repeat 2 times. Complete this exercise 3 times per week. Make sure you discuss any questions you have with your health care provider. Document Released: 05/27/2005 Document Revised: 02/01/2016 Document Reviewed:  05/04/2015 Elsevier Interactive Patient Education  2018 [employer]. n

## 2021-01-13 ENCOUNTER — Other Ambulatory Visit: Payer: Self-pay | Admitting: Family Medicine

## 2021-01-13 DIAGNOSIS — M25551 Pain in right hip: Secondary | ICD-10-CM

## 2021-03-02 ENCOUNTER — Other Ambulatory Visit (HOSPITAL_BASED_OUTPATIENT_CLINIC_OR_DEPARTMENT_OTHER): Payer: Self-pay

## 2021-03-02 ENCOUNTER — Ambulatory Visit: Payer: No Typology Code available for payment source | Attending: Internal Medicine

## 2021-03-02 DIAGNOSIS — Z23 Encounter for immunization: Secondary | ICD-10-CM

## 2021-03-02 MED ORDER — INFLUENZA VAC SPLIT QUAD 0.5 ML IM SUSY
PREFILLED_SYRINGE | INTRAMUSCULAR | 0 refills | Status: DC
  Filled 2021-03-02: qty 0.5, 1d supply, fill #0

## 2021-03-02 NOTE — Progress Notes (Signed)
   Covid-19 Vaccination Clinic  Name:  Mylo Driskill    MRN: [id number] DOB: 07-10-1970  03/02/2021  Ms. Bulnes was observed post Covid-19 immunization for 15 minutes without incident. She was provided with Vaccine Information Sheet and instruction to access the V-Safe system.   Ms. Jeancharles was instructed to call 911 with any severe reactions post vaccine: Difficulty breathing  Swelling of face and throat  A fast heartbeat  A bad rash all over body  Dizziness and weakness

## 2021-03-08 ENCOUNTER — Ambulatory Visit: Payer: Self-pay

## 2021-03-12 ENCOUNTER — Other Ambulatory Visit (HOSPITAL_BASED_OUTPATIENT_CLINIC_OR_DEPARTMENT_OTHER): Payer: Self-pay

## 2021-03-12 MED ORDER — PFIZER COVID-19 VAC BIVALENT 30 MCG/0.3ML IM SUSP
INTRAMUSCULAR | 0 refills | Status: DC
  Filled 2021-03-12: qty 0.3, 1d supply, fill #0

## 2021-11-30 ENCOUNTER — Encounter: Payer: Self-pay | Admitting: Family Medicine

## 2021-11-30 ENCOUNTER — Ambulatory Visit (INDEPENDENT_AMBULATORY_CARE_PROVIDER_SITE_OTHER): Payer: 59 | Admitting: Family Medicine

## 2021-11-30 VITALS — BP 110/68 | HR 68 | Temp 98.5°F | Ht 60.0 in | Wt 114.4 lb

## 2021-11-30 DIAGNOSIS — Z0000 Encounter for general adult medical examination without abnormal findings: Secondary | ICD-10-CM

## 2021-11-30 DIAGNOSIS — Z1211 Encounter for screening for malignant neoplasm of colon: Secondary | ICD-10-CM

## 2021-11-30 DIAGNOSIS — Z23 Encounter for immunization: Secondary | ICD-10-CM | POA: Diagnosis not present

## 2021-11-30 DIAGNOSIS — L989 Disorder of the skin and subcutaneous tissue, unspecified: Secondary | ICD-10-CM

## 2021-11-30 DIAGNOSIS — Z1231 Encounter for screening mammogram for malignant neoplasm of breast: Secondary | ICD-10-CM

## 2021-11-30 NOTE — Progress Notes (Signed)
Chief Complaint  Patient presents with   Annual Exam     Well Woman Pamela Hicks is here for a complete physical.   Her last physical was >1 year ago.  Current diet: in general, a "healthy" diet. Current exercise: none. Weight is stable and she denies fatigue out of ordinary. Seatbelt? Yes Advanced directive? She thinks so  Health Maintenance Pap/HPV- Yes Mammogram- No Colon cancer screening-No Shingrix- No Tetanus- Yes Hep C screening- Yes HIV screening- Yes  Past Medical History:  Diagnosis Date   No known health problems      Past Surgical History:  Procedure Laterality Date   BREAST LUMPECTOMY Right 1997    Medications  Current Outpatient Medications on File Prior to Visit  Medication Sig Dispense Refill   ascorbic acid (VITAMIN C) 500 MG tablet Take 500 mg by mouth daily.     Calcium-Magnesium-Vitamin D (CALCIUM MAGNESIUM PO) Take 1 tablet by mouth daily.     COVID-19 mRNA bivalent vaccine, Pfizer, (PFIZER COVID-19 VAC BIVALENT) injection Inject into the muscle. 0.3 mL 0   Cyanocobalamin (VITAMIN B 12 PO) Take 1 tablet by mouth daily.     ferrous sulfate 325 (65 FE) MG tablet Take 325 mg by mouth daily with breakfast.     Homeopathic Products (LIVER SUPPORT SL) Place 1 tablet under the tongue daily.     influenza vac split quadrivalent PF (FLUARIX) 0.5 ML injection Inject into the muscle. 0.5 mL 0   Vitamin D, Cholecalciferol, 25 MCG (1000 UT) CAPS Take 1,000 Units by mouth daily.     Allergies No Known Allergies  Review of Systems: Constitutional:  no unexpected weight changes Eye:  no recent significant change in vision Ear/Nose/Mouth/Throat:  Ears:  no recent change in hearing Nose/Mouth/Throat:  no complaints of nasal congestion, no sore throat Cardiovascular: no chest pain Respiratory:  no shortness of breath Gastrointestinal:  no abdominal pain, no change in bowel habits GU:  Female: negative for dysuria or pelvic  pain Musculoskeletal/Extremities:  no pain of the joints Integumentary (Skin/Breast):  no abnormal skin lesions reported Neurologic:  no headaches Endocrine:  denies fatigue  Exam BP 110/68   Pulse 68   Temp 98.5 F (36.9 C) (Oral)   Ht 5' (1.524 m)   Wt 114 lb 6 oz (51.9 kg)   SpO2 99%   BMI 22.34 kg/m  General:  well developed, well nourished, in no apparent distress Skin:  no significant moles, warts, or growths Head:  no masses, lesions, or tenderness Eyes:  pupils equal and round, sclera anicteric without injection Ears:  canals without lesions, TMs shiny without retraction, no obvious effusion, no erythema Nose:  nares patent, septum midline, mucosa normal, and no drainage or sinus tenderness Throat/Pharynx:  lips and gingiva without lesion; tongue and uvula midline; non-inflamed pharynx; no exudates or postnasal drainage Neck: neck supple without adenopathy, thyromegaly, or masses Lungs:  clear to auscultation, breath sounds equal bilaterally, no respiratory distress Cardio:  regular rate and rhythm, no LE edema Abdomen:  abdomen soft, nontender; bowel sounds normal; no masses or organomegaly Genital: Defer to GYN Musculoskeletal:  symmetrical muscle groups noted without atrophy or deformity Extremities:  no clubbing, cyanosis, or edema, no deformities, no skin discoloration Neuro:  gait normal; deep tendon reflexes normal and symmetric Psych: well oriented with normal range of affect and appropriate judgment/insight  Assessment and Plan  Well adult exam - Plan: CBC, Comprehensive metabolic panel, Lipid panel  Encounter for screening mammogram for malignant neoplasm of breast -  Plan: MM DIGITAL SCREENING BILATERAL  Screen for colon cancer - Plan: Ambulatory referral to Gastroenterology  Skin lesion - Plan: Ambulatory referral to Dermatology   Well 51 y.o. female. Counseled on diet and exercise. Advanced directive form requested today. A form was also given as she  is not fully sure if she has an up to date copy.  1st Shingrix today. 2nd in 2 mo.  [facility]: Refer GI Skin lesion: Refer derm [facility]: Ordered mammogram Other orders as above. Follow up 1 yr for CPE. The patient voiced understanding and agreement to the plan.  Jilda Roche [place], DO 11/30/21 2:42 PM

## 2021-11-30 NOTE — Patient Instructions (Addendum)
Give us 2-3 business days to get the results of your labs back.   Keep the diet clean and stay active.  If you do not hear anything about your referral in the next 1-2 weeks, call our office and ask for an update.  Let us know if you need anything. 

## 2021-12-03 NOTE — Addendum Note (Signed)
Addended by: Scharlene Gloss B on: 12/03/2021 04:19 PM   Modules accepted: Orders

## 2021-12-08 ENCOUNTER — Encounter: Payer: Self-pay | Admitting: Family Medicine

## 2021-12-12 IMAGING — US US PELVIS COMPLETE WITH TRANSVAGINAL
1 series · 13 of 25 positions shown · non-contrast
Comparison: None

CLINICAL DATA: Abnormal uterine bleeding, LMP 11/15/2019

EXAM:
TRANSABDOMINAL AND TRANSVAGINAL ULTRASOUND OF PELVIS
TECHNIQUE: Both transabdominal and transvaginal ultrasound examinations of the
pelvis were performed. Transabdominal technique was performed for
global imaging of the pelvis including uterus, ovaries, adnexal
regions, and pelvic cul-de-sac. It was necessary to proceed with
endovaginal exam following the transabdominal exam to visualize the
endometrium, and to characterize a cystic lesion in the LEFT adnexa.

[Series 1: us pelvis complete with transvaginal · 0.20mm/px · 144 acquisitions, 13 frames shown]
[im 1/144]
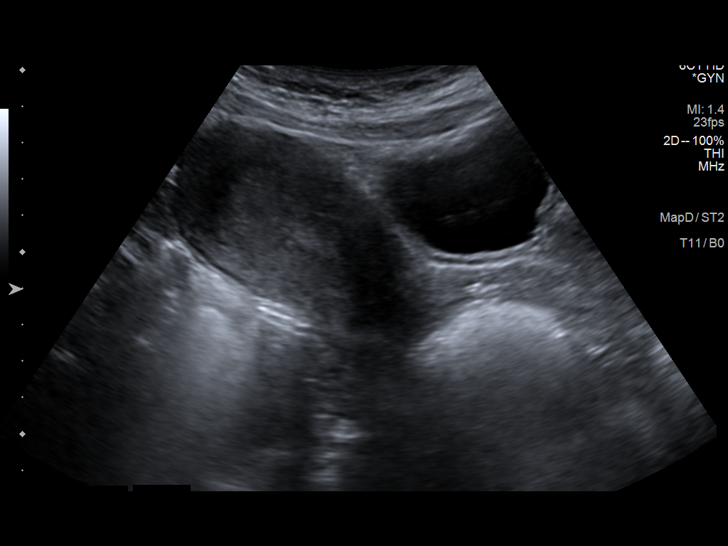
[im 12/144]
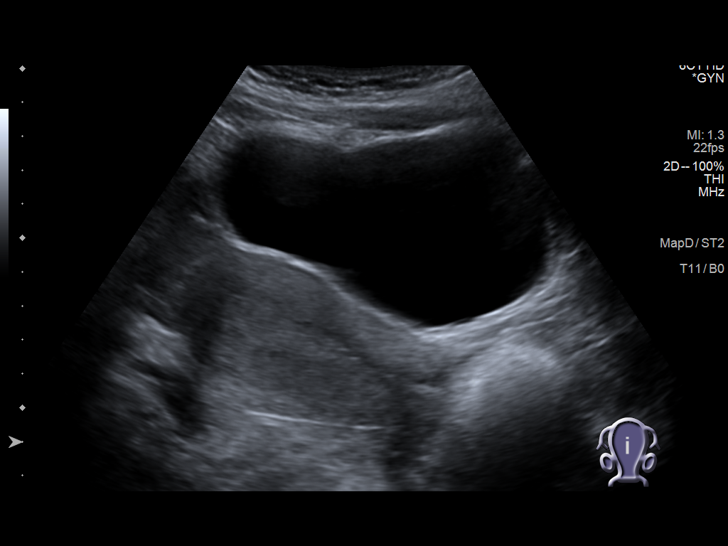
[im 24/144]
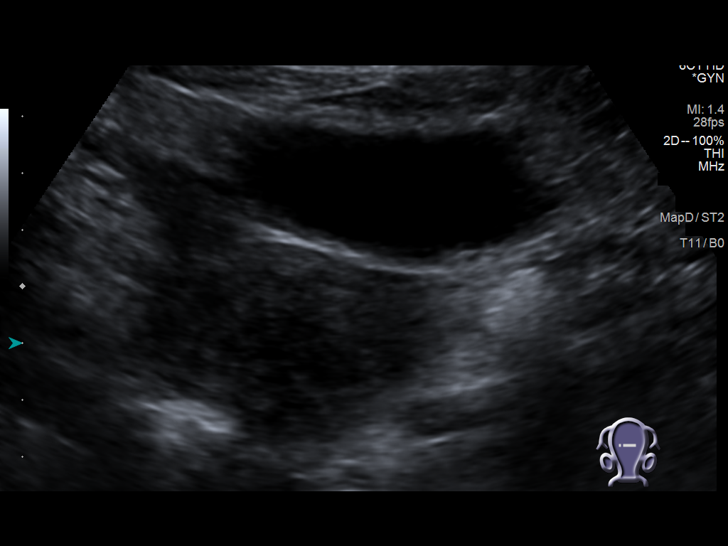
[im 36/144]
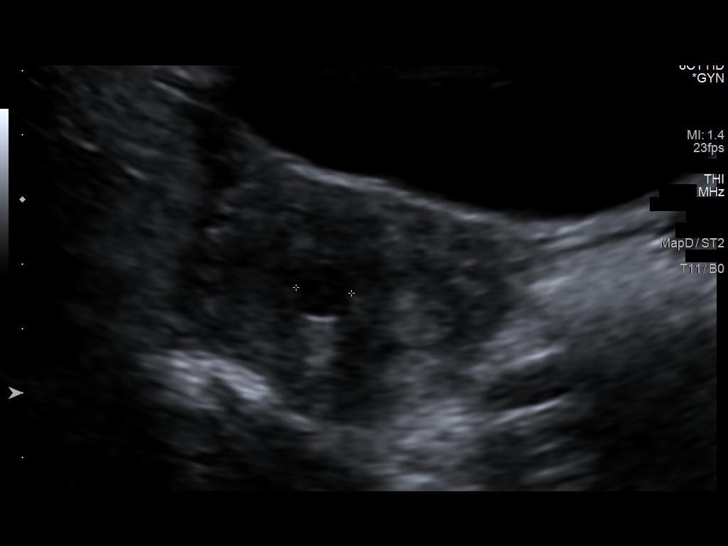
[im 48/144]
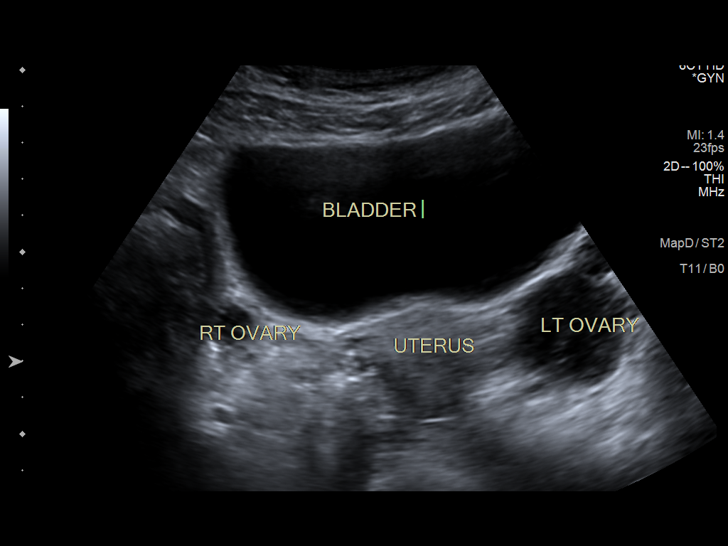
[im 60/144]
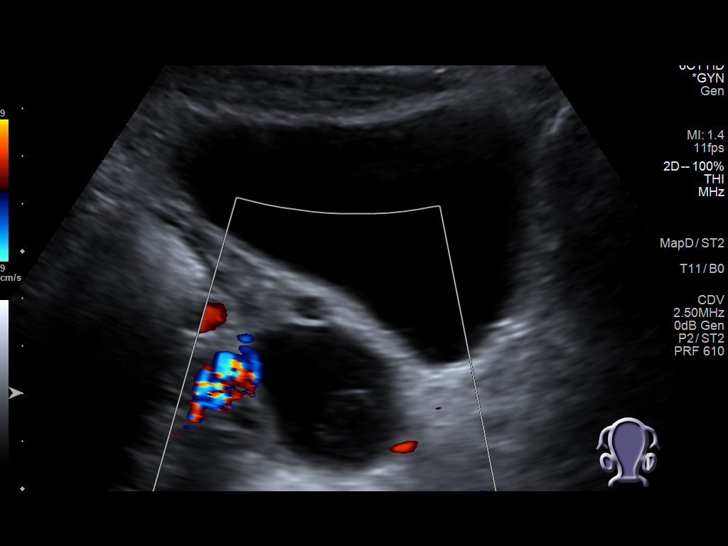
[im 72/144]
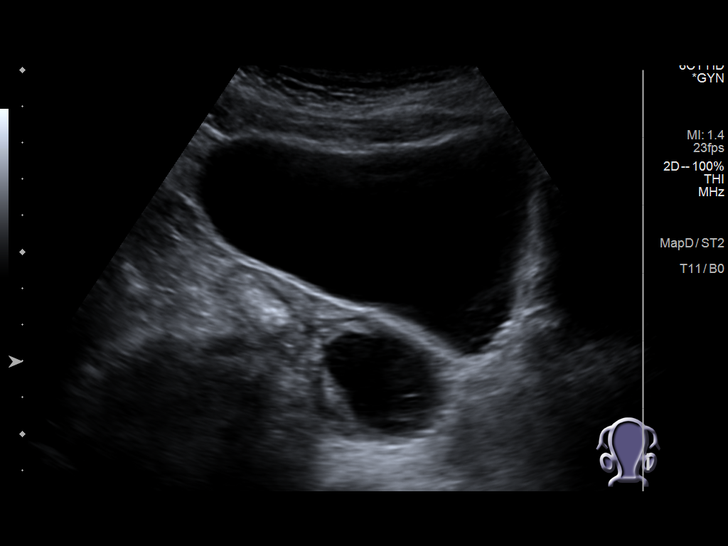
[im 84/144]
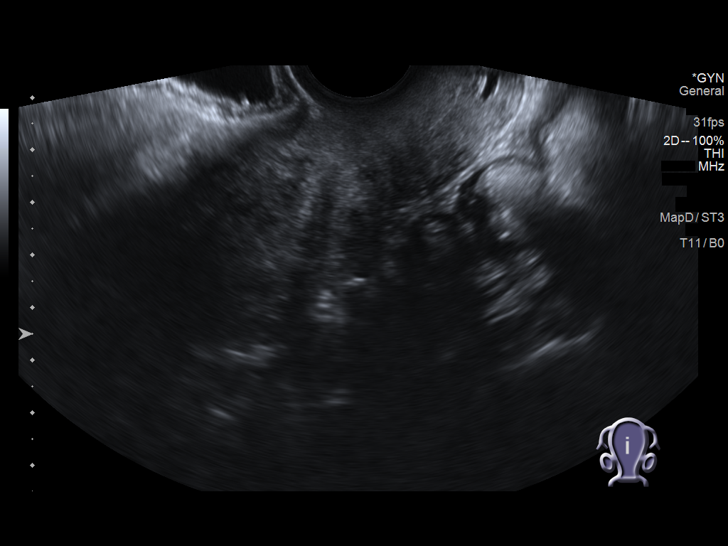
[im 96/144]
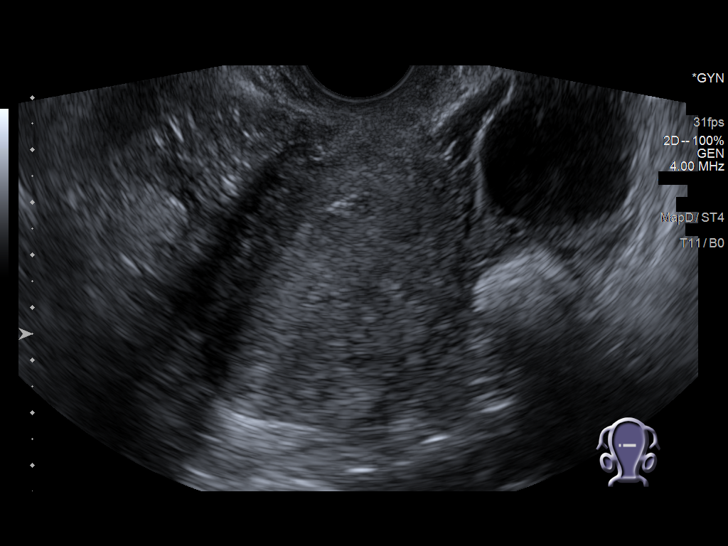
[im 108/144]
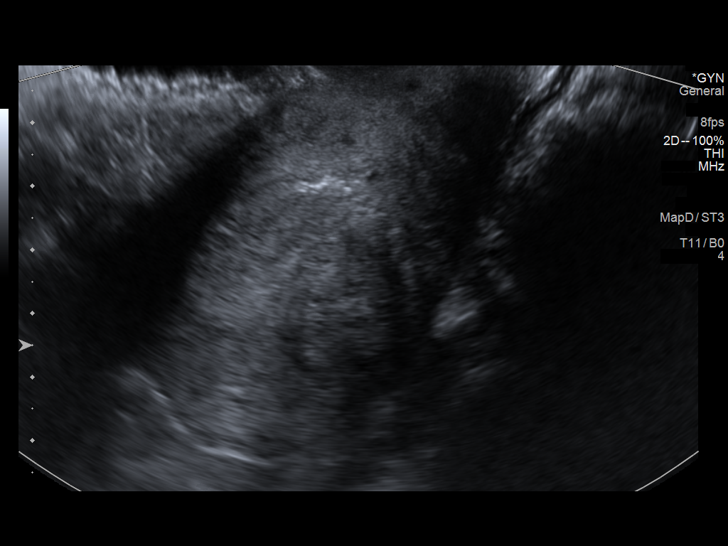
[im 120/144]
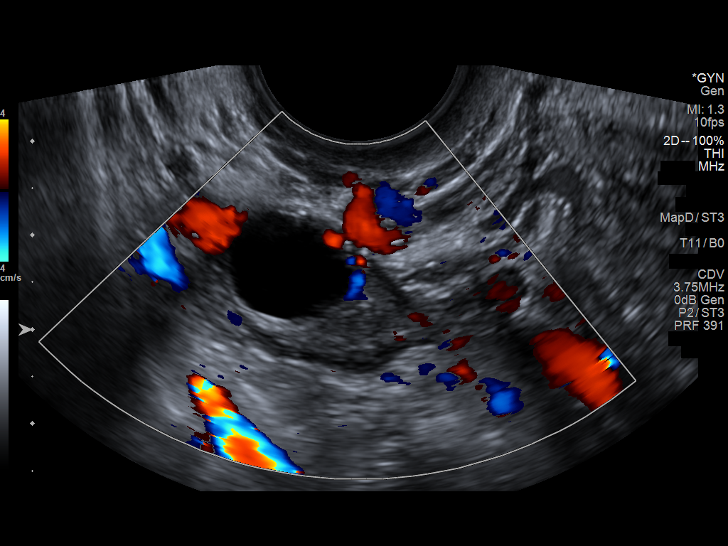
[im 132/144]
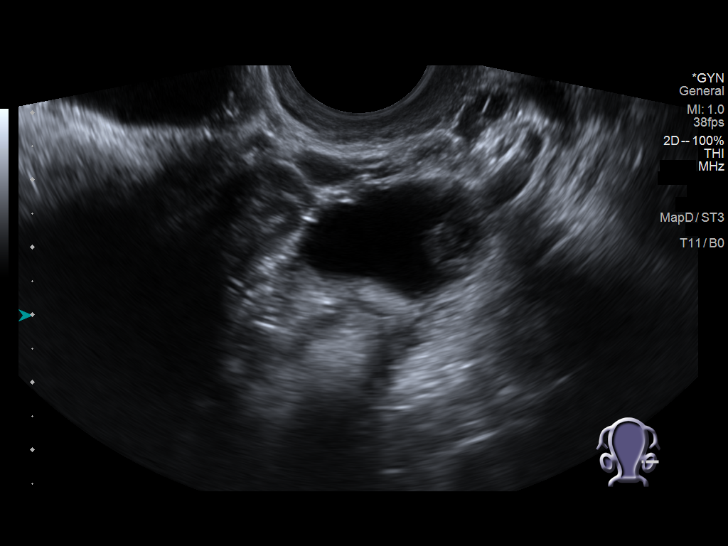
[im 144/144]
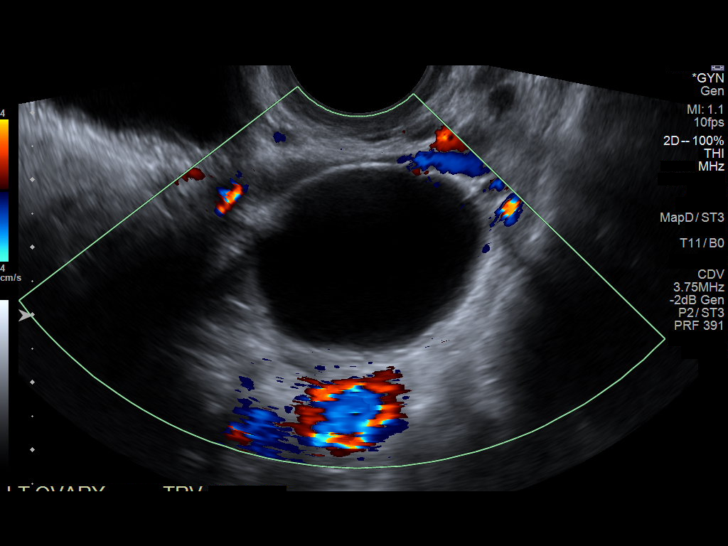

[13 of 25 positions shown; findings below may reference images not displayed]

FINDINGS: Uterus

Measurements: 8.4 x 4.4 x 5.5 cm = volume: 106 mL. Small myometrial
cyst at upper uterus 10 x 8 x 9 mm. No additional mass.

Endometrium

Thickness: 16 mm.  No endometrial fluid or focal abnormality

Right ovary

Measurements: 2.4 x 1.6 x 2.3 cm = volume: 5 mL. Dominant
physiologic follicle without mass

Left ovary

Measurements: 5.1 x 3.5 x 4.2 cm = volume: 39 mL. Cyst within LEFT
ovary 3.8 x 3.0 x 3.5 cm containing a single thin internal cyst
question mature follicle. No additional mass

Other findings

No free pelvic fluid or additional adnexal masses.
IMPRESSION: Large dominant follicle within LEFT ovary.

Thickened endometrial complex 16 mm thick; if bleeding remains
unresponsive to hormonal or medical therapy, focal lesion work-up
with sonohysterogram should be considered. Endometrial biopsy should
also be considered in pre-menopausal patients at high risk for
endometrial carcinoma. (Ref: Radiological Reasoning: Algorithmic
Workup of Abnormal Vaginal Bleeding with Endovaginal Sonography and
Sonohysterography. AJR [id number]; [id number])

## 2021-12-13 ENCOUNTER — Other Ambulatory Visit (INDEPENDENT_AMBULATORY_CARE_PROVIDER_SITE_OTHER): Payer: 59

## 2021-12-13 DIAGNOSIS — Z0000 Encounter for general adult medical examination without abnormal findings: Secondary | ICD-10-CM

## 2021-12-13 LAB — LIPID PANEL
Cholesterol: 208 mg/dL — ABNORMAL HIGH (ref 0–200)
HDL: 75 mg/dL (ref >39.00)
LDL Cholesterol: 115 mg/dL — ABNORMAL HIGH (ref 0–99)
NonHDL: 132.72
Total CHOL/HDL Ratio: 3
Triglycerides: 87 mg/dL (ref 0.0–149.0)
VLDL: 17.4 mg/dL (ref 0.0–40.0)

## 2021-12-13 LAB — CBC
HCT: 39.2 % (ref 36.0–46.0)
Hemoglobin: 12.8 g/dL (ref 12.0–15.0)
MCHC: 32.6 g/dL (ref 30.0–36.0)
MCV: 82.4 fl (ref 78.0–100.0)
Platelets: 324 10*3/uL (ref 150.0–400.0)
RBC: 4.75 Mil/uL (ref 3.87–5.11)
RDW: 15.3 % (ref 11.5–15.5)
WBC: 5 10*3/uL (ref 4.0–10.5)

## 2021-12-13 LAB — COMPREHENSIVE METABOLIC PANEL
ALT: 21 U/L (ref 0–35)
AST: 22 U/L (ref 0–37)
Albumin: 4.3 g/dL (ref 3.5–5.2)
Alkaline Phosphatase: 80 U/L (ref 39–117)
BUN: 10 mg/dL (ref 6–23)
CO2: 30 mEq/L (ref 19–32)
Calcium: 9.2 mg/dL (ref 8.4–10.5)
Chloride: 104 mEq/L (ref 96–112)
Creatinine, Ser: 0.67 mg/dL (ref 0.40–1.20)
GFR: 102.17 mL/min (ref >60.00)
Glucose, Bld: 89 mg/dL (ref 70–99)
Potassium: 4.3 mEq/L (ref 3.5–5.1)
Sodium: 142 mEq/L (ref 135–145)
Total Bilirubin: 0.5 mg/dL (ref 0.2–1.2)
Total Protein: 7 g/dL (ref 6.0–8.3)

## 2022-02-27 ENCOUNTER — Encounter: Payer: Self-pay | Admitting: Family Medicine

## 2022-03-23 ENCOUNTER — Ambulatory Visit: Payer: 59

## 2022-03-23 ENCOUNTER — Telehealth: Payer: Self-pay

## 2022-03-23 NOTE — Telephone Encounter (Signed)
Appt rescheduled

## 2022-03-23 NOTE — Telephone Encounter (Signed)
Caller Name [place] Phone Number [phone] Patient Name Pamela Hicks Patient DOB [birth year] Call Type Message Only Information Provided Reason for Call Request to Reschedule Office Appointment Initial Comment Caller states she has an appointment at [place] and needing to reschedule. Patient request to speak to RN No Additional Comment Office hour provided. Disp. Time Disposition Final User 03/23/2022 7:31:55 AM General Information Provided Yes Many, [name] Call Closed By: Sanjuana Kava Transaction Date/Time: 03/23/2022 7:29:30 AM (ET)

## 2022-03-25 ENCOUNTER — Ambulatory Visit: Payer: 59

## 2022-04-07 ENCOUNTER — Ambulatory Visit: Payer: 59

## 2022-04-21 ENCOUNTER — Ambulatory Visit (INDEPENDENT_AMBULATORY_CARE_PROVIDER_SITE_OTHER): Payer: 59

## 2022-04-21 ENCOUNTER — Ambulatory Visit: Payer: 59

## 2022-04-21 DIAGNOSIS — Z23 Encounter for immunization: Secondary | ICD-10-CM

## 2022-04-21 NOTE — Progress Notes (Signed)
Pt here today for Shingrix #2 per Dr. Carmelia Roller.   Shingrix 0.21mL injected into L deltoid. Pt tolerated injection well.

## 2022-04-23 ENCOUNTER — Encounter: Payer: Self-pay | Admitting: Family Medicine

## 2022-09-20 ENCOUNTER — Telehealth: Payer: Self-pay | Admitting: Family Medicine

## 2022-09-20 ENCOUNTER — Encounter: Payer: Self-pay | Admitting: Gastroenterology

## 2022-09-20 NOTE — Telephone Encounter (Signed)
Pt called to schedule appt at [facility] and they need another referral sent. Fax # [phone]

## 2022-09-21 ENCOUNTER — Other Ambulatory Visit (HOSPITAL_COMMUNITY): Payer: Self-pay | Admitting: Family Medicine

## 2022-09-21 DIAGNOSIS — Z1231 Encounter for screening mammogram for malignant neoplasm of breast: Secondary | ICD-10-CM

## 2022-09-22 ENCOUNTER — Inpatient Hospital Stay
Admission: RE | Admit: 2022-09-22 | Discharge: 2022-09-22 | Disposition: A | Discharge status: Home / Self Care | Payer: Self-pay | Source: Ambulatory Visit | Attending: Family Medicine | Admitting: Family Medicine

## 2022-09-22 ENCOUNTER — Other Ambulatory Visit (HOSPITAL_COMMUNITY): Payer: Self-pay | Admitting: Family Medicine

## 2022-09-22 DIAGNOSIS — Z1231 Encounter for screening mammogram for malignant neoplasm of breast: Secondary | ICD-10-CM

## 2022-09-23 ENCOUNTER — Inpatient Hospital Stay (HOSPITAL_COMMUNITY): Admission: RE | Admit: 2022-09-23 | Payer: No Typology Code available for payment source | Source: Ambulatory Visit

## 2022-09-23 ENCOUNTER — Other Ambulatory Visit (HOSPITAL_COMMUNITY): Payer: Self-pay | Admitting: Family Medicine

## 2022-09-23 ENCOUNTER — Other Ambulatory Visit (HOSPITAL_COMMUNITY): Payer: Self-pay | Admitting: Obstetrics & Gynecology

## 2022-09-23 DIAGNOSIS — Z1231 Encounter for screening mammogram for malignant neoplasm of breast: Secondary | ICD-10-CM

## 2022-09-26 ENCOUNTER — Inpatient Hospital Stay (HOSPITAL_COMMUNITY): Admission: RE | Admit: 2022-09-26 | Payer: No Typology Code available for payment source | Source: Ambulatory Visit

## 2022-10-14 ENCOUNTER — Encounter: Payer: 59 | Admitting: Gastroenterology

## 2022-11-15 ENCOUNTER — Other Ambulatory Visit: Payer: Self-pay | Admitting: Family Medicine

## 2022-11-15 DIAGNOSIS — Z1231 Encounter for screening mammogram for malignant neoplasm of breast: Secondary | ICD-10-CM

## 2022-11-16 ENCOUNTER — Ambulatory Visit
Admission: RE | Admit: 2022-11-16 | Discharge: 2022-11-16 | Disposition: A | Discharge status: Home / Self Care | Payer: 59 | Source: Ambulatory Visit

## 2022-11-16 DIAGNOSIS — Z1231 Encounter for screening mammogram for malignant neoplasm of breast: Secondary | ICD-10-CM

## 2022-12-21 ENCOUNTER — Encounter (INDEPENDENT_AMBULATORY_CARE_PROVIDER_SITE_OTHER): Payer: Self-pay

## 2023-01-12 ENCOUNTER — Other Ambulatory Visit: Payer: Self-pay | Admitting: Family Medicine

## 2023-01-12 ENCOUNTER — Ambulatory Visit (INDEPENDENT_AMBULATORY_CARE_PROVIDER_SITE_OTHER): Payer: 59 | Admitting: Family Medicine

## 2023-01-12 ENCOUNTER — Encounter: Payer: Self-pay | Admitting: Family Medicine

## 2023-01-12 VITALS — BP 108/62 | HR 72 | Temp 98.4°F | Ht 59.0 in | Wt 116.5 lb

## 2023-01-12 DIAGNOSIS — Z1211 Encounter for screening for malignant neoplasm of colon: Secondary | ICD-10-CM

## 2023-01-12 DIAGNOSIS — E162 Hypoglycemia, unspecified: Secondary | ICD-10-CM | POA: Diagnosis not present

## 2023-01-12 DIAGNOSIS — Z0000 Encounter for general adult medical examination without abnormal findings: Secondary | ICD-10-CM | POA: Diagnosis not present

## 2023-01-12 DIAGNOSIS — R109 Unspecified abdominal pain: Secondary | ICD-10-CM | POA: Diagnosis not present

## 2023-01-12 DIAGNOSIS — E785 Hyperlipidemia, unspecified: Secondary | ICD-10-CM

## 2023-01-12 LAB — CBC
HCT: 41.8 % (ref 36.0–46.0)
Hemoglobin: 13.5 g/dL (ref 12.0–15.0)
MCHC: 32.2 g/dL (ref 30.0–36.0)
MCV: 92.7 fl (ref 78.0–100.0)
Platelets: 313 10*3/uL (ref 150.0–400.0)
RBC: 4.51 Mil/uL (ref 3.87–5.11)
RDW: 13.2 % (ref 11.5–15.5)
WBC: 4.4 10*3/uL (ref 4.0–10.5)

## 2023-01-12 LAB — COMPREHENSIVE METABOLIC PANEL
ALT: 13 U/L (ref 0–35)
AST: 18 U/L (ref 0–37)
Albumin: 4.2 g/dL (ref 3.5–5.2)
Alkaline Phosphatase: 56 U/L (ref 39–117)
BUN: 11 mg/dL (ref 6–23)
CO2: 31 mEq/L (ref 19–32)
Calcium: 9.4 mg/dL (ref 8.4–10.5)
Chloride: 103 mEq/L (ref 96–112)
Creatinine, Ser: 0.65 mg/dL (ref 0.40–1.20)
GFR: 102.14 mL/min (ref >60.00)
Glucose, Bld: 101 mg/dL — ABNORMAL HIGH (ref 70–99)
Potassium: 4.1 mEq/L (ref 3.5–5.1)
Sodium: 141 mEq/L (ref 135–145)
Total Bilirubin: 0.5 mg/dL (ref 0.2–1.2)
Total Protein: 6.8 g/dL (ref 6.0–8.3)

## 2023-01-12 LAB — LIPID PANEL
Cholesterol: 235 mg/dL — ABNORMAL HIGH (ref 0–200)
HDL: 66 mg/dL (ref >39.00)
LDL Cholesterol: 151 mg/dL — ABNORMAL HIGH (ref 0–99)
NonHDL: 169.01
Total CHOL/HDL Ratio: 4
Triglycerides: 91 mg/dL (ref 0.0–149.0)
VLDL: 18.2 mg/dL (ref 0.0–40.0)

## 2023-01-12 LAB — HEMOGLOBIN A1C: Hgb A1c MFr Bld: 5.5 % (ref 4.6–6.5)

## 2023-01-12 MED ORDER — DICYCLOMINE HCL 10 MG PO CAPS
ORAL_CAPSULE | ORAL | 0 refills | Status: DC
Start: 2023-01-12 — End: 2023-08-28

## 2023-01-12 NOTE — Progress Notes (Signed)
Chief Complaint  Patient presents with   Annual Exam    Digestive problems Diarrhea, cramping and constipation Concerned BS drops making her feel faint     Well Woman Pamela Hicks is here for a complete physical.   Her last physical was >1 year ago.  Current diet: in general, a "healthy" diet. Current exercise: walking. Weight is stable and she denies fatigue out of ordinary. Seatbelt? Yes Advanced directive? No  Health Maintenance Pap/HPV- Yes Mammogram- Yes Colon cancer screening- No Shingrix- Yes Tetanus- Yes Hep C screening- Yes HIV screening- Yes  Over the past month, the patient has been having intermittent blood sugar drops.  She will consume something with simple sugar after experiencing this and symptoms are resolved.  She has shaking and overall feeling poor when it happens.  It has happened 4 times in the past month.  No change in eating.  She thought was related to bread she was eating and causing her sugars to drop but after changing this out with yams, it is still happening.  No history of diabetes.  Over the past 2 months, the patient is been having intermittent and alternating constipation/diarrhea, abdominal cramping.  She has been under a lot of stress in the last 2 months.  This is reportedly coming to an end.  She has not tried anything at home so far other than dietary changes which has helped to some extent.  She is not having any bleeding, nighttime awakenings, unintentional weight loss, or fevers.  Past Medical History:  Diagnosis Date   No known health problems      Past Surgical History:  Procedure Laterality Date   BREAST LUMPECTOMY Right 1997    Medications  Current Outpatient Medications on File Prior to Visit  Medication Sig Dispense Refill   ascorbic acid (VITAMIN C) 500 MG tablet Take 500 mg by mouth daily.     Calcium-Magnesium-Vitamin D (CALCIUM MAGNESIUM PO) Take 1 tablet by mouth daily.     Cyanocobalamin (VITAMIN B 12 PO) Take 1  tablet by mouth daily.     ferrous sulfate 325 (65 FE) MG tablet Take 325 mg by mouth daily with breakfast.     Homeopathic Products (LIVER SUPPORT SL) Place 1 tablet under the tongue daily.     Vitamin D, Cholecalciferol, 25 MCG (1000 UT) CAPS Take 1,000 Units by mouth daily.     Allergies No Known Allergies  Review of Systems: Constitutional:  no unexpected weight changes Eye:  no recent significant change in vision Ear/Nose/Mouth/Throat:  Ears:  no recent change in hearing Nose/Mouth/Throat:  no complaints of nasal congestion, no sore throat Cardiovascular: no chest pain Respiratory:  no shortness of breath Gastrointestinal: As noted in HPI GU:  Female: negative for dysuria or pelvic pain Musculoskeletal/Extremities:  no pain of the joints Integumentary (Skin/Breast):  no abnormal skin lesions reported Neurologic:  no headaches Endocrine:  denies fatigue  Exam BP 108/62 (BP Location: Left Arm, Patient Position: Sitting, Cuff Size: Normal)   Pulse 72   Temp 98.4 F (36.9 C) (Oral)   Ht 4\' 11"  (1.499 m)   Wt 116 lb 8 oz (52.8 kg)   SpO2 99%   BMI 23.53 kg/m  General:  well developed, well nourished, in no apparent distress Skin:  no significant moles, warts, or growths Head:  no masses, lesions, or tenderness Eyes:  pupils equal and round, sclera anicteric without injection Ears:  canals without lesions, TMs shiny without retraction, no obvious effusion, no erythema Nose:  nares patent, mucosa normal, and no drainage  Throat/Pharynx:  lips and gingiva without lesion; tongue and uvula midline; non-inflamed pharynx; no exudates or postnasal drainage Neck: neck supple without adenopathy, thyromegaly, or masses Lungs:  clear to auscultation, breath sounds equal bilaterally, no respiratory distress Cardio:  regular rate and rhythm, no LE edema Abdomen:  abdomen soft, nontender; bowel sounds normal; no masses or organomegaly Genital: Defer to GYN Musculoskeletal:  symmetrical  muscle groups noted without atrophy or deformity Extremities:  no clubbing, cyanosis, or edema, no deformities, no skin discoloration Neuro:  gait normal; deep tendon reflexes normal and symmetric Psych: well oriented with normal range of affect and appropriate judgment/insight  Assessment and Plan  Well adult exam - Plan: CBC, Comprehensive metabolic panel, Lipid panel  Abdominal cramping - Plan: dicyclomine (BENTYL) 10 MG capsule  Screen for colon cancer - Plan: Ambulatory referral to Gastroenterology   Well 52 y.o. female. Counseled on diet and exercise. Other orders as above. Advanced directive form provided today.  CCS: Refer GI. Abd pain: Sound like situational IBS.  Dicyclomine as needed.  Concentrated peppermint/IBgard also recommended.  We discussed a daily TCA which she politely declined at this time.  The underlying source of the stress is coming to an end so she does not anticipate requiring treatment much longer.  She is due for colon cancer screening so we will place referral today. Follow up in 1 yr. The patient voiced understanding and agreement to the plan.  Jilda Roche [place], DO 01/12/23 9:40 AM

## 2023-01-12 NOTE — Patient Instructions (Addendum)
Give [place] 2-3 business days to get the results of your labs back.   Keep the diet clean and stay active.  Please consider adding some weight resistance exercise to your routine. Consider yoga as well.   Please get me a copy of your advanced directive form at your convenience.   I recommend getting the flu shot in mid October. This suggestion would change if the CDC comes out with a different recommendation.   If you do not hear anything about your referral in the next 1-2 weeks, call our office and ask for an update.  Consider concentrated peppermint for your symptoms. The brand name is IBGard.   Let us know if you need anything.  Please consider counseling. Contact [phone] to schedule an appointment or inquire about cost/insurance coverage.  [facility] located at [address], [place], [place].  Phone number = [phone].  Dr. Regan Lemming - Adult Psychiatry.    [facility] located at [address] [place], [place], [place]. Phone number = [phone].   [facility] located at [address] [place], [place].  Phone number = [phone].   [facility] located at [address] [place], [place], [place].  Phone number = [phone].

## 2023-01-13 LAB — INSULIN, RANDOM: Insulin: 9.9 u[IU]/mL

## 2023-01-13 LAB — C-PEPTIDE: C-Peptide: 2.34 ng/mL (ref 0.80–3.85)

## 2023-01-20 LAB — PROINSULIN: Proinsulin: 9.2 pmol/L (ref <=18.8)

## 2023-01-21 ENCOUNTER — Encounter: Payer: Self-pay | Admitting: Family Medicine

## 2023-03-16 ENCOUNTER — Other Ambulatory Visit (INDEPENDENT_AMBULATORY_CARE_PROVIDER_SITE_OTHER): Payer: BLUE CROSS/BLUE SHIELD

## 2023-03-16 DIAGNOSIS — E785 Hyperlipidemia, unspecified: Secondary | ICD-10-CM | POA: Diagnosis not present

## 2023-03-16 LAB — LIPID PANEL
Cholesterol: 226 mg/dL — ABNORMAL HIGH (ref 0–200)
HDL: 80 mg/dL (ref >39.00)
LDL Cholesterol: 133 mg/dL — ABNORMAL HIGH (ref 0–99)
NonHDL: 146.3
Total CHOL/HDL Ratio: 3
Triglycerides: 69 mg/dL (ref 0.0–149.0)
VLDL: 13.8 mg/dL (ref 0.0–40.0)

## 2023-03-17 ENCOUNTER — Encounter: Payer: Self-pay | Admitting: Family Medicine

## 2023-06-12 ENCOUNTER — Ambulatory Visit: Payer: BC Managed Care – PPO | Admitting: Family Medicine

## 2023-06-12 ENCOUNTER — Encounter: Payer: Self-pay | Admitting: Family Medicine

## 2023-06-12 VITALS — BP 104/72 | HR 65 | Temp 97.9°F | Ht 59.0 in | Wt 118.0 lb

## 2023-06-12 DIAGNOSIS — G8929 Other chronic pain: Secondary | ICD-10-CM | POA: Diagnosis not present

## 2023-06-12 DIAGNOSIS — M25512 Pain in left shoulder: Secondary | ICD-10-CM | POA: Diagnosis not present

## 2023-06-12 NOTE — Patient Instructions (Signed)
If you do not hear anything about your referral in the next 1-2 weeks, call our office and ask for an update.  Ice/cold pack over area for 10-15 min twice daily.  Heat (pad or rice pillow in microwave) over affected area, 10-15 minutes twice daily.   OK to take Tylenol 1000 mg (2 extra strength tabs) or 975 mg (3 regular strength tabs) every 6 hours as needed.  Let us know if you need anything.  EXERCISES  RANGE OF MOTION (ROM) AND STRETCHING EXERCISES These exercises may help you when beginning to rehabilitate your injury. While completing these exercises, remember:  Restoring tissue flexibility helps normal motion to return to the joints. This allows healthier, less painful movement and activity. An effective stretch should be held for at least 30 seconds. A stretch should never be painful. You should only feel a gentle lengthening or release in the stretched tissue.  ROM - Pendulum Bend at the waist so that your right / left arm falls away from your body. Support yourself with your opposite hand on a solid surface, such as a table or a countertop. Your right / left arm should be perpendicular to the ground. If it is not perpendicular, you need to lean over farther. Relax the muscles in your right / left arm and shoulder as much as possible. Gently sway your hips and trunk so they move your right / left arm without any use of your right / left shoulder muscles. Progress your movements so that your right / left arm moves side to side, then forward and backward, and finally, both clockwise and counterclockwise. Complete 10-15 repetitions in each direction. Many people use this exercise to relieve discomfort in their shoulder as well as to gain range of motion. Repeat 2 times. Complete this exercise 3 times per week.  STRETCH - Flexion, Standing Stand with good posture. With an underhand grip on your right / left hand and an overhand grip on the opposite hand, grasp a broomstick or cane so  that your hands are a little more than shoulder-width apart. Keeping your right / left elbow straight and shoulder muscles relaxed, push the stick with your opposite hand to raise your right / left arm in front of your body and then overhead. Raise your arm until you feel a stretch in your right / left shoulder, but before you have increased shoulder pain. Try to avoid shrugging your right / left shoulder as your arm rises by keeping your shoulder blade tucked down and toward your mid-back spine. Hold 30 seconds. Slowly return to the starting position. Repeat 2 times. Complete this exercise 3 times per week.  STRETCH - Internal Rotation Place your right / left hand behind your back, palm-up. Throw a towel or belt over your opposite shoulder. Grasp the towel/belt with your right / left hand. While keeping an upright posture, gently pull up on the towel/belt until you feel a stretch in the front of your right / left shoulder. Avoid shrugging your right / left shoulder as your arm rises by keeping your shoulder blade tucked down and toward your mid-back spine. Hold 30. Release the stretch by lowering your opposite hand. Repeat 2 times. Complete this exercise 3 times per week.  STRETCH - External Rotation and Abduction Stagger your stance through a doorframe. It does not matter which foot is forward. As instructed by your physician, physical therapist or athletic trainer, place your hands: And forearms above your head and on the door frame. And  forearms at head-height and on the door frame. At elbow-height and on the door frame. Keeping your head and chest upright and your stomach muscles tight to prevent over-extending your low-back, slowly shift your weight onto your front foot until you feel a stretch across your chest and/or in the front of your shoulders. Hold 30 seconds. Shift your weight to your back foot to release the stretch. Repeat 2 times. Complete this stretch 3 times per week.    STRENGTHENING EXERCISES  These exercises may help you when beginning to rehabilitate your injury. They may resolve your symptoms with or without further involvement from your physician, physical therapist or athletic trainer. While completing these exercises, remember:  Muscles can gain both the endurance and the strength needed for everyday activities through controlled exercises. Complete these exercises as instructed by your physician, physical therapist or athletic trainer. Progress the resistance and repetitions only as guided. You may experience muscle soreness or fatigue, but the pain or discomfort you are trying to eliminate should never worsen during these exercises. If this pain does worsen, stop and make certain you are following the directions exactly. If the pain is still present after adjustments, discontinue the exercise until you can discuss the trouble with your clinician. If advised by your physician, during your recovery, avoid activity or exercises which involve actions that place your right / left hand or elbow above your head or behind your back or head. These positions stress the tissues which are trying to heal.  STRENGTH - Scapular Depression and Adduction With good posture, sit on a firm chair. Supported your arms in front of you with pillows, arm rests or a table top. Have your elbows in line with the sides of your body. Gently draw your shoulder blades down and toward your mid-back spine. Gradually increase the tension without tensing the muscles along the top of your shoulders and the back of your neck. Hold for 3 seconds. Slowly release the tension and relax your muscles completely before completing the next repetition. After you have practiced this exercise, remove the arm support and complete it in standing as well as sitting. Repeat 2 times. Complete this exercise 3 times per week.   STRENGTH - External Rotators Secure a rubber exercise band/tubing to a fixed  object so that it is at the same height as your right / left elbow when you are standing or sitting on a firm surface. Stand or sit so that the secured exercise band/tubing is at your side that is not injured. Bend your elbow 90 degrees. Place a folded towel or small pillow under your right / left arm so that your elbow is a few inches away from your side. Keeping the tension on the exercise band/tubing, pull it away from your body, as if pivoting on your elbow. Be sure to keep your body steady so that the movement is only coming from your shoulder rotating. Hold 3 seconds. Release the tension in a controlled manner as you return to the starting position. Repeat 2 times. Complete this exercise 3 times per week.   STRENGTH - Supraspinatus Stand or sit with good posture. Grasp a 2-3 lb weight or an exercise band/tubing so that your hand is "thumbs-up," like when you shake hands. Slowly lift your right / left hand from your thigh into the air, traveling about 30 degrees from straight out at your side. Lift your hand to shoulder height or as far as you can without increasing any shoulder pain. Initially, many  people do not lift their hands above shoulder height. Avoid shrugging your right / left shoulder as your arm rises by keeping your shoulder blade tucked down and toward your mid-back spine. Hold for 3 seconds. Control the descent of your hand as you slowly return to your starting position. Repeat 2 times. Complete this exercise 3 times per week.   STRENGTH - Shoulder Extensors Secure a rubber exercise band/tubing so that it is at the height of your shoulders when you are either standing or sitting on a firm arm-less chair. With a thumbs-up grip, grasp an end of the band/tubing in each hand. Straighten your elbows and lift your hands straight in front of you at shoulder height. Step back away from the secured end of band/tubing until it becomes tense. Squeezing your shoulder blades together, pull  your hands down to the sides of your thighs. Do not allow your hands to go behind you. Hold for 3 seconds. Slowly ease the tension on the band/tubing as you reverse the directions and return to the starting position. Repeat 2 times. Complete this exercise 3 times per week.   STRENGTH - Scapular Retractors Secure a rubber exercise band/tubing so that it is at the height of your shoulders when you are either standing or sitting on a firm arm-less chair. With a palm-down grip, grasp an end of the band/tubing in each hand. Straighten your elbows and lift your hands straight in front of you at shoulder height. Step back away from the secured end of band/tubing until it becomes tense. Squeezing your shoulder blades together, draw your elbows back as you bend them. Keep your upper arm lifted away from your body throughout the exercise. Hold 3 seconds. Slowly ease the tension on the band/tubing as you reverse the directions and return to the starting position. Repeat 2 times. Complete this exercise 3 times per week.  STRENGTH - Scapular Depressors Find a sturdy chair without wheels, such as a from a dining room table. Keeping your feet on the floor, lift your bottom from the seat and lock your elbows. Keeping your elbows straight, allow gravity to pull your body weight down. Your shoulders will rise toward your ears. Raise your body against gravity by drawing your shoulder blades down your back, shortening the distance between your shoulders and ears. Although your feet should always maintain contact with the floor, your feet should progressively support less body weight as you get stronger. Hold 3 seconds. In a controlled and slow manner, lower your body weight to begin the next repetition. Repeat 2 times. Complete this exercise 3 times per week.    This information is not intended to replace advice given to you by your health care provider. Make sure you discuss any questions you have with your health  care provider.   Document Released: 03/23/2005 Document Revised: 05/30/2014 Document Reviewed: 08/21/2008 Elsevier Interactive Patient Education [employer]

## 2023-06-12 NOTE — Progress Notes (Signed)
Musculoskeletal Exam  Patient: Pamela Hicks DOB: 01/22/1971  DOS: 06/12/2023  SUBJECTIVE:  Chief Complaint:   Chief Complaint  Patient presents with   Shoulder Pain    Left side- towards neck and around to breast/chest area Level-when moving, up to a 9. Present for- few months Injury- no injury OTC- none    Pamela Hicks is a 53 y.o.  female for evaluation and treatment of L shoulder pain.   Onset:  7 months ago. No inj or change in activity.  Location: overlying L shoulder Character:  aching and dull  Progression of issue:  has worsened Associated symptoms: decreased ROM No bruising, redness, swelling, catching/locking Treatment: to date has been home exercises.   Neurovascular symptoms: no  Past Medical History:  Diagnosis Date   No known health problems     Objective: VITAL SIGNS: BP 104/72   Pulse 65   Temp 97.9 F (36.6 C) (Oral)   Ht 4\' 11"  (1.499 m)   Wt 118 lb (53.5 kg)   SpO2 100%   BMI 23.83 kg/m  Constitutional: Well formed, well developed. No acute distress. Thorax & Lungs: No accessory muscle use Musculoskeletal: L shoulder.   Normal active range of motion: no.   Normal passive range of motion: no Tenderness to palpation: no Deformity: no Ecchymosis: no Tests positive: Neer's, Hawkins, empty can Tests negative: Speeds, crossover Internal rotation did not reproduce any pain Neurologic: Normal sensory function. No focal deficits noted. DTR's equal and symmetric in UE's. No clonus. Psychiatric: Normal mood. Age appropriate judgment and insight. Alert & oriented x 3.    Assessment:  Chronic left shoulder pain - Plan: Ambulatory referral to Orthopedic Surgery  Plan: Will refer to the shoulder team.  Although certainly has adhesive capsulitis.  This has limited her exam to pinpoint further issues.  Stretches/exercises, heat, ice, Tylenol, meloxicam as needed.  F/u as originally scheduled. The patient voiced understanding and agreement to  the plan.   Jilda Roche [place], DO 06/12/23  4:39 PM

## 2023-08-02 ENCOUNTER — Encounter: Payer: Self-pay | Admitting: Family Medicine

## 2023-08-02 DIAGNOSIS — Z1211 Encounter for screening for malignant neoplasm of colon: Secondary | ICD-10-CM

## 2023-08-08 ENCOUNTER — Encounter: Payer: Self-pay | Admitting: Internal Medicine

## 2023-08-08 ENCOUNTER — Ambulatory Visit (AMBULATORY_SURGERY_CENTER): Admitting: *Deleted

## 2023-08-08 VITALS — Ht 59.0 in | Wt 110.0 lb

## 2023-08-08 DIAGNOSIS — Z1211 Encounter for screening for malignant neoplasm of colon: Secondary | ICD-10-CM

## 2023-08-08 MED ORDER — SUFLAVE 178.7 G PO SOLR: 1.0000 | Freq: Once | ORAL | 0 refills | Status: AC

## 2023-08-08 NOTE — Progress Notes (Signed)
 Pt's name and DOB verified at the beginning of the pre-visit wit 2 identifiers  Pt denies any difficulty with ambulating,sitting, laying down or rolling side to side  Pt has no issues with ambulation   Pt has no issues moving head neck or swallowing  No egg or soy allergy known to patient   No issues known to pt with past sedation with any surgeries or procedures  Pt denies having issues being intubated  No FH of Malignant Hyperthermia  Pt is not on diet pills or shots  Pt is not on home 02   Pt is not on blood thinners   Pt has frequent issues with constipation RN instructed pt to use Miralax per bottles instructions a week before prep days. Pt states they will  Pt is not on dialysis  Pt says she had an issue but can't remember but says Card MD said it was OK  Pt denies any upcoming cardiac testing  Chart not reviewed by CRNA prior to PV  Visit by phone  Pt states weight is 110 lb   IInstructions reviewed. Pt given [facility] main # and MD on call # prior to instructions.  Pt states understanding of instructions. Instructed to review again prior to procedure. Pt states they will.   Informed pt that they will receive a text or  call from [facility] regarding there prep med.

## 2023-08-28 ENCOUNTER — Encounter: Payer: Self-pay | Admitting: Internal Medicine

## 2023-08-28 ENCOUNTER — Ambulatory Visit (AMBULATORY_SURGERY_CENTER): Admitting: Internal Medicine

## 2023-08-28 VITALS — BP 108/70 | HR 59 | Temp 98.4°F | Resp 12 | Ht 59.0 in | Wt 110.0 lb

## 2023-08-28 DIAGNOSIS — Z1211 Encounter for screening for malignant neoplasm of colon: Secondary | ICD-10-CM | POA: Diagnosis not present

## 2023-08-28 MED ORDER — SODIUM CHLORIDE 0.9 % IV SOLN
500.0000 mL | Freq: Once | INTRAVENOUS | Status: DC
Start: 2023-08-28 — End: 2023-08-28

## 2023-08-28 NOTE — Progress Notes (Signed)
 Pt resting comfortably. VSS. Airway intact. SBAR complete to RN. All questions answered.

## 2023-08-28 NOTE — Progress Notes (Signed)
 Pt positioned in left lateral decubitus per pt comfort related to left should bursitis. PPP. Will monitor.

## 2023-08-28 NOTE — Progress Notes (Unsigned)
 HISTORY OF PRESENT ILLNESS:  Pamela Hicks is a 53 y.o. female sent directly for routine screening colonoscopy.  No complaints  REVIEW OF SYSTEMS:  All non-GI ROS negative except for  Past Medical History:  Diagnosis Date   Anxiety    Hypoglycemia    No known health problems     Past Surgical History:  Procedure Laterality Date   BREAST LUMPECTOMY Right 1997    Social History Pamela Hicks  reports that she has quit smoking. Her smoking use included cigarettes. She has never used smokeless tobacco. She reports that she does not drink alcohol and does not use drugs.  family history includes Cancer in her mother and sister.  No Known Allergies     PHYSICAL EXAMINATION: Vital signs: BP 110/64   Pulse 60   Temp 98.4 F (36.9 C) (Temporal)   Ht 4\' 11"  (1.499 m)   Wt 110 lb (49.9 kg)   LMP 04/23/2023 (Approximate)   SpO2 100%   BMI 22.22 kg/m  General: Well-developed, well-nourished, no acute distress HEENT: Sclerae are anicteric, conjunctiva pink. Oral mucosa intact Lungs: Clear Heart: Regular Abdomen: soft, nontender, nondistended, no obvious ascites, no peritoneal signs, normal bowel sounds. No organomegaly. Extremities: No edema Psychiatric: alert and oriented x3. Cooperative     ASSESSMENT:  Colon cancer screening   PLAN:  Screening colonoscopy

## 2023-08-28 NOTE — Progress Notes (Unsigned)
 Pt's states no medical or surgical changes since previsit or office visit.

## 2023-08-28 NOTE — Op Note (Signed)
 [facility] Patient Name: Pamela Hicks Procedure Date: 08/28/2023 3:56 PM MRN: [id number] Endoscopist: Wilhemina Bonito. Marina Goodell , MD, [id number] Age: 53 Referring MD:  Date of Birth: [birth year] Gender: Female Account #: [id number] Procedure:                Colonoscopy Indications:              Screening for colorectal malignant neoplasm Medicines:                Monitored Anesthesia Care Procedure:                Pre-Anesthesia Assessment:                           - Prior to the procedure, a History and Physical                            was performed, and patient medications and                            allergies were reviewed. The patient's tolerance of                            previous anesthesia was also reviewed. The risks                            and benefits of the procedure and the sedation                            options and risks were discussed with the patient.                            All questions were answered, and informed consent                            was obtained. Prior Anticoagulants: The patient has                            taken no anticoagulant or antiplatelet agents. ASA                            Grade Assessment: II - A patient with mild systemic                            disease. After reviewing the risks and benefits,                            the patient was deemed in satisfactory condition to                            undergo the procedure.                           After obtaining informed consent, the colonoscope  was passed under direct vision. Throughout the                            procedure, the patient's blood pressure, pulse, and                            oxygen saturations were monitored continuously. The                            Olympus Scope SN: [id number] was introduced through                            the anus and advanced to the the cecum, identified                            by  appendiceal orifice and ileocecal valve. The                            ileocecal valve, appendiceal orifice, and rectum                            (excellent view from the anal os, narrow vault                            would not permit retroflexion of adult scope) were                            photographed. The quality of the bowel preparation                            was excellent. The colonoscopy was performed                            without difficulty. The patient tolerated the                            procedure well. The bowel preparation used was                            SUPREP via split dose instruction. Scope In: 4:19:53 PM Scope Out: 4:32:14 PM Scope Withdrawal Time: 0 hours 8 minutes 12 seconds  Total Procedure Duration: 0 hours 12 minutes 21 seconds  Findings:                 The entire examined colon appeared normal. Complications:            No immediate complications. Estimated blood loss:                            None. Estimated Blood Loss:     Estimated blood loss: none. Impression:               - The entire examined colon is normal.                           -  No specimens collected. Recommendation:           - Repeat colonoscopy in 10 years for screening                            purposes.                           - Patient has a contact number available for                            emergencies. The signs and symptoms of potential                            delayed complications were discussed with the                            patient. Return to normal activities tomorrow.                            Written discharge instructions were provided to the                            patient.                           - Resume previous diet.                           - Continue present medications. Wilhemina Bonito. Marina Goodell, MD 08/28/2023 4:38:11 PM This report has been signed electronically.

## 2023-08-28 NOTE — Patient Instructions (Addendum)
    Use [facility] as Dr Marina Goodell talked with you about in recovery room  Continue previous diet & medications  YOU HAD AN ENDOSCOPIC PROCEDURE TODAY AT THE [facility]:   Refer to the procedure report that was given to you for any specific questions about what was found during the examination.  If the procedure report does not answer your questions, please call your gastroenterologist to clarify.  If you requested that your care partner not be given the details of your procedure findings, then the procedure report has been included in a sealed envelope for you to review at your convenience later.  YOU SHOULD EXPECT: Some feelings of bloating in the abdomen. Passage of more gas than usual.  Walking can help get rid of the air that was put into your GI tract during the procedure and reduce the bloating. If you had a lower endoscopy (such as a colonoscopy or flexible sigmoidoscopy) you may notice spotting of blood in your stool or on the toilet paper. If you underwent a bowel prep for your procedure, you may not have a normal bowel movement for a few days.  Please Note:  You might notice some irritation and congestion in your nose or some drainage.  This is from the oxygen used during your procedure.  There is no need for concern and it should clear up in a day or so.  SYMPTOMS TO REPORT IMMEDIATELY:  Following lower endoscopy (colonoscopy or flexible sigmoidoscopy):  Excessive amounts of blood in the stool  Significant tenderness or worsening of abdominal pains  Swelling of the abdomen that is new, acute  Fever of 100F or higher  For urgent or emergent issues, a gastroenterologist can be reached at any hour by calling [phone]. Do not use MyChart messaging for urgent concerns.    DIET:  We do recommend a small meal at first, but then you may proceed to your regular diet.  Drink plenty of fluids but you should avoid alcoholic beverages for 24 hours.  ACTIVITY:  You should plan  to take it easy for the rest of today and you should NOT DRIVE or use heavy machinery until tomorrow (because of the sedation medicines used during the test).    FOLLOW UP: Our staff will call the number listed on your records the next business day following your procedure.  We will call around 7:15- 8:00 am to check on you and address any questions or concerns that you may have regarding the information given to you following your procedure. If we do not reach you, we will leave a message.     If any biopsies were taken you will be contacted by phone or by letter within the next 1-3 weeks.  Please call us at [phone] if you have not heard about the biopsies in 3 weeks.    SIGNATURES/CONFIDENTIALITY: You and/or your care partner have signed paperwork which will be entered into your electronic medical record.  These signatures attest to the fact that that the information above on your After Visit Summary has been reviewed and is understood.  Full responsibility of the confidentiality of this discharge information lies with you and/or your care-partner.

## 2023-08-29 ENCOUNTER — Telehealth: Payer: Self-pay

## 2023-08-29 NOTE — Telephone Encounter (Signed)
Post procedure follow up, no answer

## 2023-10-18 ENCOUNTER — Other Ambulatory Visit: Payer: Self-pay | Admitting: Family Medicine

## 2023-10-18 DIAGNOSIS — Z1231 Encounter for screening mammogram for malignant neoplasm of breast: Secondary | ICD-10-CM

## 2023-11-17 ENCOUNTER — Encounter

## 2023-11-17 DIAGNOSIS — Z1231 Encounter for screening mammogram for malignant neoplasm of breast: Secondary | ICD-10-CM

## 2023-11-20 ENCOUNTER — Other Ambulatory Visit: Payer: Self-pay | Admitting: Family Medicine

## 2023-11-20 DIAGNOSIS — Z1231 Encounter for screening mammogram for malignant neoplasm of breast: Secondary | ICD-10-CM

## 2023-12-04 ENCOUNTER — Encounter

## 2023-12-04 DIAGNOSIS — Z1231 Encounter for screening mammogram for malignant neoplasm of breast: Secondary | ICD-10-CM

## 2023-12-05 ENCOUNTER — Other Ambulatory Visit: Payer: Self-pay | Admitting: Family Medicine

## 2023-12-05 DIAGNOSIS — Z1231 Encounter for screening mammogram for malignant neoplasm of breast: Secondary | ICD-10-CM

## 2023-12-13 ENCOUNTER — Ambulatory Visit
Admission: RE | Admit: 2023-12-13 | Discharge: 2023-12-13 | Disposition: A | Discharge status: Home / Self Care | Source: Ambulatory Visit

## 2023-12-13 DIAGNOSIS — Z1231 Encounter for screening mammogram for malignant neoplasm of breast: Secondary | ICD-10-CM

## 2024-01-02 ENCOUNTER — Encounter: Payer: Self-pay | Admitting: Family Medicine

## 2024-01-02 ENCOUNTER — Ambulatory Visit: Admitting: Family Medicine

## 2024-01-02 VITALS — BP 112/68 | HR 69 | Temp 97.8°F | Resp 16 | Ht 59.0 in | Wt 118.2 lb

## 2024-01-02 DIAGNOSIS — Z91018 Allergy to other foods: Secondary | ICD-10-CM | POA: Diagnosis not present

## 2024-01-02 DIAGNOSIS — L309 Dermatitis, unspecified: Secondary | ICD-10-CM | POA: Diagnosis not present

## 2024-01-02 DIAGNOSIS — Z87898 Personal history of other specified conditions: Secondary | ICD-10-CM

## 2024-01-02 MED ORDER — EPINEPHRINE 0.3 MG/0.3ML IJ SOAJ: 0.3000 mg | INTRAMUSCULAR | 1 refills | Status: AC | PRN

## 2024-01-02 MED ORDER — TRIAMCINOLONE ACETONIDE 0.1 % EX CREA: 1.0000 | TOPICAL_CREAM | Freq: Two times a day (BID) | CUTANEOUS | 0 refills | Status: DC

## 2024-01-02 MED ORDER — LEVOCETIRIZINE DIHYDROCHLORIDE 5 MG PO TABS: 5.0000 mg | ORAL_TABLET | Freq: Every evening | ORAL | 2 refills | Status: DC

## 2024-01-02 NOTE — Progress Notes (Signed)
 Chief Complaint  Patient presents with   Rash    Rash     Pamela Hicks is a 53 y.o. female here for a skin complaint.  Duration: 3 months Location: shoulders, head, hands Pruritic? Yes Painful? No Drainage? No New soaps/lotions/topicals/detergents? No Famhx? Nothing obvious Other associated symptoms: seems to be associated with soy sauce Therapies tried thus far: elimination diet, OTC steroids (helped short term), fexofenadine  Past Medical History:  Diagnosis Date   Anxiety     BP 112/68 (BP Location: Left Arm, Patient Position: Sitting)   Pulse 69   Temp 97.8 F (36.6 C) (Oral)   Resp 16   Ht 4' 11 (1.499 m)   Wt 118 lb 3.2 oz (53.6 kg)   SpO2 99%   BMI 23.87 kg/m  Gen: awake, alert, appearing stated age Lungs: No accessory muscle use Skin: Patches slightly hyperpigmentation over the dorsum of the proximal forearm on the right with some excoriation.  There is some scaling/hyperkeratinization over the scalp without obvious silver scales or pink base.  No drainage, erythema, TTP, fluctuance Psych: Age appropriate judgment and insight  Eczema, unspecified type - Plan: triamcinolone  cream (KENALOG ) 0.1 %  History of angioedema - Plan: Ambulatory referral to Allergy   Food allergy  - Plan: Food Allergy  Profile, Ambulatory referral to Allergy , EPINEPHrine  (EPIPEN  2-PAK) 0.3 mg/0.3 mL IJ SOAJ injection  Chronic issues without current control.  Triamcinolone  0.1% twice daily as needed.  This does not look like psoriasis.  Okay to continue OTC steroid cream on the face.  Refer to the allergist.  Check a food allergy  panel.  Send an EpiPen .  Xyzal  5 mg daily as needed.  If not covered, transition to 180 mg of fexofenadine or 10 mg of cetirizine.  Avoid scented products, twice daily nonscented emollient use, take lukewarm showers, cool/cool compresses could be helpful. F/u as originally scheduled. The patient voiced understanding and agreement to the plan.  Mabel [place]  [place], DO 01/02/24 8:37 AM

## 2024-01-02 NOTE — Patient Instructions (Addendum)
 Give us  2-3 business days to get the results of your labs back.   Try not to scratch as this can make things worse. Avoid scented products while dealing with this. You may resume when the itchiness resolves. Cold/cool compresses can help.   If you do not hear anything about your referral in the next 1-2 weeks, call our office and ask for an update.  Let us  know if you need anything.

## 2024-01-04 ENCOUNTER — Ambulatory Visit: Payer: Self-pay | Admitting: Family Medicine

## 2024-01-05 LAB — FOOD ALLERGY PROFILE
Allergen, Salmon, f41: <0.1 kU/L
Almonds: 1.16 kU/L — ABNORMAL HIGH
Brazil Nut: 0.34 kU/L — ABNORMAL HIGH
CLASS: 0
CLASS: 0
CLASS: 0
CLASS: 0
CLASS: 2
CLASS: 2
CLASS: 2
CLASS: 2
CLASS: 2
CLASS: 2
CLASS: 2
CLASS: 2
Cashew IgE: 0.81 kU/L — ABNORMAL HIGH
Class: 0
Class: 2
Class: 2
Class: 2
Egg White IgE: 0.81 kU/L — ABNORMAL HIGH
Fish Cod: <0.1 kU/L
Hazelnut: 1.79 kU/L — ABNORMAL HIGH
Macadamia Nut: 2.14 kU/L — ABNORMAL HIGH
Milk IgE: 0.92 kU/L — ABNORMAL HIGH
Peanut IgE: 2.15 kU/L — ABNORMAL HIGH
Scallop IgE: <0.1 kU/L
Sesame Seed f10: 2.09 kU/L — ABNORMAL HIGH
Shrimp IgE: <0.1 kU/L
Soybean IgE: 1.06 kU/L — ABNORMAL HIGH
Tuna IgE: <0.1 kU/L
Walnut: 1.07 kU/L — ABNORMAL HIGH
Wheat IgE: 1.47 kU/L — ABNORMAL HIGH

## 2024-01-05 LAB — MISC CASHEW NUT: ANA O 3 IgE: <0.1 kU/L (ref <0.10)

## 2024-01-05 LAB — MISC HAZELNUT COMP PNL
Cor a1(f428): <0.1 kU/L (ref <0.10)
Cor a14(f439): <0.1 kU/L (ref <0.10)
Cor a8(f425): <0.1 kU/L (ref <0.10)
Cor a9(f440): 0.51 kU/L — ABNORMAL HIGH (ref <0.10)

## 2024-01-05 LAB — PEANUT COMPONENT PANEL REFLEX
Ara h 1 (f422): <0.1 kU/L (ref <0.10)
Ara h 2 (f423): <0.1 kU/L (ref <0.10)
Ara h 3 (f424): <0.1 kU/L (ref <0.10)
Ara h 8 (f352): <0.1 kU/L (ref <0.10)
Ara h 9 (f427: <0.1 kU/L (ref <0.10)
F447-IgE Ara h 6: <0.1 kU/L (ref <0.10)

## 2024-01-05 LAB — MILK COMPONENT PANEL RFLX
Allergen, Alpha-lactalb,f76: 0.46 kU/L — ABNORMAL HIGH
Allergen, Beta-lactoglob,f77: 1.09 kU/L — ABNORMAL HIGH
Allergen, Casein, f78: 0.18 kU/L — ABNORMAL HIGH
CLASS: 1
Class: 2

## 2024-01-05 LAB — INTERPRETATION:

## 2024-01-05 LAB — MISC WALNUT COMP PNL
MISCELLANEOUS: <0.1 kU/L (ref <0.10)
rJug r3 (f442): <0.1 kU/L (ref <0.10)

## 2024-01-05 LAB — EGG COMPONENT PANEL REFLEX
Allergen, Ovalbumin, f232: <0.1 kU/L
Allergen, Ovomucoid, f233: <0.1 kU/L
CLASS: 0
CLASS: 0

## 2024-01-05 LAB — MISC BRAZIL NUT: Brazil Nut Component: <0.1 kU/L (ref <0.10)

## 2024-01-05 MED ORDER — TRIAMCINOLONE ACETONIDE 0.1 % EX CREA
1.0000 | TOPICAL_CREAM | Freq: Two times a day (BID) | CUTANEOUS | 0 refills | Status: AC
Start: 2024-01-05 — End: ?

## 2024-01-05 MED ORDER — LEVOCETIRIZINE DIHYDROCHLORIDE 5 MG PO TABS: 5.0000 mg | ORAL_TABLET | Freq: Every evening | ORAL | 2 refills | Status: AC

## 2024-03-05 ENCOUNTER — Ambulatory Visit: Admitting: Family Medicine
